# Patient Record
Sex: Female | Born: 2000 | Race: White | Hispanic: No | Marital: Single | Smoking: Never smoker
Health system: Southern US, Community
[De-identification: ages and names within clinical notes are randomized; demographics above are authoritative.]

## PROBLEM LIST (undated history)

## (undated) DIAGNOSIS — D582 Other hemoglobinopathies: Secondary | ICD-10-CM

## (undated) DIAGNOSIS — R000 Tachycardia, unspecified: Secondary | ICD-10-CM

## (undated) DIAGNOSIS — F419 Anxiety disorder, unspecified: Secondary | ICD-10-CM

## (undated) DIAGNOSIS — Z789 Other specified health status: Secondary | ICD-10-CM

## (undated) DIAGNOSIS — J45909 Unspecified asthma, uncomplicated: Secondary | ICD-10-CM

## (undated) DIAGNOSIS — I10 Essential (primary) hypertension: Secondary | ICD-10-CM

## (undated) DIAGNOSIS — K219 Gastro-esophageal reflux disease without esophagitis: Secondary | ICD-10-CM

## (undated) HISTORY — PX: OTHER SURGICAL HISTORY: SHX169

## (undated) HISTORY — DX: Tachycardia, unspecified: R00.0

## (undated) HISTORY — PX: TONSILLECTOMY: SUR1361

## (undated) HISTORY — DX: Essential (primary) hypertension: I10

## (undated) HISTORY — DX: Other specified health status: Z78.9

## (undated) HISTORY — DX: Unspecified asthma, uncomplicated: J45.909

---
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
## 2022-02-05 DIAGNOSIS — J449 Chronic obstructive pulmonary disease, unspecified: Secondary | ICD-10-CM | POA: Diagnosis not present

## 2022-02-05 DIAGNOSIS — J8281 Chronic eosinophilic pneumonia: Secondary | ICD-10-CM | POA: Diagnosis not present

## 2022-02-05 DIAGNOSIS — Z20822 Contact with and (suspected) exposure to covid-19: Secondary | ICD-10-CM | POA: Diagnosis not present

## 2022-02-05 DIAGNOSIS — J9601 Acute respiratory failure with hypoxia: Secondary | ICD-10-CM | POA: Diagnosis not present

## 2022-02-05 DIAGNOSIS — J40 Bronchitis, not specified as acute or chronic: Secondary | ICD-10-CM | POA: Diagnosis not present

## 2022-02-05 DIAGNOSIS — R000 Tachycardia, unspecified: Secondary | ICD-10-CM | POA: Diagnosis not present

## 2022-02-06 DIAGNOSIS — R000 Tachycardia, unspecified: Secondary | ICD-10-CM | POA: Diagnosis not present

## 2022-02-06 DIAGNOSIS — J9601 Acute respiratory failure with hypoxia: Secondary | ICD-10-CM | POA: Diagnosis not present

## 2022-02-06 DIAGNOSIS — J8281 Chronic eosinophilic pneumonia: Secondary | ICD-10-CM | POA: Diagnosis not present

## 2022-02-07 DIAGNOSIS — F17210 Nicotine dependence, cigarettes, uncomplicated: Secondary | ICD-10-CM | POA: Diagnosis not present

## 2022-02-07 DIAGNOSIS — J9601 Acute respiratory failure with hypoxia: Secondary | ICD-10-CM | POA: Diagnosis not present

## 2022-02-07 DIAGNOSIS — J8283 Eosinophilic asthma: Secondary | ICD-10-CM | POA: Diagnosis not present

## 2022-02-07 DIAGNOSIS — J4551 Severe persistent asthma with (acute) exacerbation: Secondary | ICD-10-CM | POA: Diagnosis not present

## 2022-02-07 DIAGNOSIS — R000 Tachycardia, unspecified: Secondary | ICD-10-CM | POA: Diagnosis not present

## 2022-02-07 DIAGNOSIS — F17290 Nicotine dependence, other tobacco product, uncomplicated: Secondary | ICD-10-CM | POA: Diagnosis not present

## 2022-02-08 DIAGNOSIS — J9601 Acute respiratory failure with hypoxia: Secondary | ICD-10-CM | POA: Diagnosis not present

## 2022-02-08 DIAGNOSIS — F17290 Nicotine dependence, other tobacco product, uncomplicated: Secondary | ICD-10-CM | POA: Diagnosis not present

## 2022-02-08 DIAGNOSIS — J4551 Severe persistent asthma with (acute) exacerbation: Secondary | ICD-10-CM | POA: Diagnosis not present

## 2022-02-08 DIAGNOSIS — J8283 Eosinophilic asthma: Secondary | ICD-10-CM | POA: Diagnosis not present

## 2022-02-08 DIAGNOSIS — R000 Tachycardia, unspecified: Secondary | ICD-10-CM | POA: Diagnosis not present

## 2022-04-26 ENCOUNTER — Emergency Department
Admission: EM | Admit: 2022-04-26 | Discharge: 2022-04-26 | Disposition: A | Discharge status: Home / Self Care | Payer: Medicaid Other | Attending: Student in an Organized Health Care Education/Training Program | Admitting: Student in an Organized Health Care Education/Training Program

## 2022-04-26 ENCOUNTER — Ambulatory Visit (LOCAL_COMMUNITY_HEALTH_CENTER): Payer: Medicaid Other | Admitting: Nurse Practitioner

## 2022-04-26 ENCOUNTER — Other Ambulatory Visit: Payer: Self-pay

## 2022-04-26 ENCOUNTER — Encounter: Payer: Self-pay | Admitting: *Deleted

## 2022-04-26 ENCOUNTER — Emergency Department: Payer: Medicaid Other

## 2022-04-26 VITALS — BP 136/91 | HR 136 | Ht 62.0 in | Wt 211.6 lb

## 2022-04-26 DIAGNOSIS — J101 Influenza due to other identified influenza virus with other respiratory manifestations: Secondary | ICD-10-CM | POA: Diagnosis not present

## 2022-04-26 DIAGNOSIS — Z0000 Encounter for general adult medical examination without abnormal findings: Secondary | ICD-10-CM

## 2022-04-26 DIAGNOSIS — R000 Tachycardia, unspecified: Secondary | ICD-10-CM | POA: Diagnosis not present

## 2022-04-26 DIAGNOSIS — Z3042 Encounter for surveillance of injectable contraceptive: Secondary | ICD-10-CM | POA: Diagnosis not present

## 2022-04-26 DIAGNOSIS — Z3009 Encounter for other general counseling and advice on contraception: Secondary | ICD-10-CM | POA: Diagnosis not present

## 2022-04-26 DIAGNOSIS — I10 Essential (primary) hypertension: Secondary | ICD-10-CM | POA: Diagnosis not present

## 2022-04-26 DIAGNOSIS — R0602 Shortness of breath: Secondary | ICD-10-CM | POA: Diagnosis not present

## 2022-04-26 DIAGNOSIS — Z20822 Contact with and (suspected) exposure to covid-19: Secondary | ICD-10-CM | POA: Diagnosis not present

## 2022-04-26 DIAGNOSIS — J45909 Unspecified asthma, uncomplicated: Secondary | ICD-10-CM | POA: Insufficient documentation

## 2022-04-26 DIAGNOSIS — R059 Cough, unspecified: Secondary | ICD-10-CM | POA: Diagnosis not present

## 2022-04-26 DIAGNOSIS — J029 Acute pharyngitis, unspecified: Secondary | ICD-10-CM | POA: Diagnosis not present

## 2022-04-26 LAB — BASIC METABOLIC PANEL
Anion gap: 9 (ref 5–15)
BUN: 9 mg/dL (ref 6–20)
CO2: 20 mmol/L — ABNORMAL LOW (ref 22–32)
Calcium: 9 mg/dL (ref 8.9–10.3)
Chloride: 107 mmol/L (ref 98–111)
Creatinine, Ser: 0.82 mg/dL (ref 0.44–1.00)
GFR, Estimated: >60 mL/min (ref >60)
Glucose, Bld: 109 mg/dL — ABNORMAL HIGH (ref 70–99)
Potassium: 3.6 mmol/L (ref 3.5–5.1)
Sodium: 136 mmol/L (ref 135–145)

## 2022-04-26 LAB — RESP PANEL BY RT-PCR (FLU A&B, COVID) ARPGX2
Influenza A by PCR: POSITIVE — AB
Influenza B by PCR: NEGATIVE
SARS Coronavirus 2 by RT PCR: NEGATIVE

## 2022-04-26 LAB — CBC WITH DIFFERENTIAL/PLATELET
Abs Immature Granulocytes: 0.02 10*3/uL (ref 0.00–0.07)
Basophils Absolute: 0.1 10*3/uL (ref 0.0–0.1)
Basophils Relative: 1 %
Eosinophils Absolute: 0 10*3/uL (ref 0.0–0.5)
Eosinophils Relative: 0 %
HCT: 40.1 % (ref 36.0–46.0)
Hemoglobin: 14 g/dL (ref 12.0–15.0)
Immature Granulocytes: 0 %
Lymphocytes Relative: 8 %
Lymphs Abs: 0.5 10*3/uL — ABNORMAL LOW (ref 0.7–4.0)
MCH: 25.9 pg — ABNORMAL LOW (ref 26.0–34.0)
MCHC: 34.9 g/dL (ref 30.0–36.0)
MCV: 74.3 fL — ABNORMAL LOW (ref 80.0–100.0)
Monocytes Absolute: 1 10*3/uL (ref 0.1–1.0)
Monocytes Relative: 13 %
Neutro Abs: 5.5 10*3/uL (ref 1.7–7.7)
Neutrophils Relative %: 78 %
Platelets: 383 10*3/uL (ref 150–400)
RBC: 5.4 MIL/uL — ABNORMAL HIGH (ref 3.87–5.11)
RDW: 13.5 % (ref 11.5–15.5)
WBC: 7.1 10*3/uL (ref 4.0–10.5)
nRBC: 0 % (ref 0.0–0.2)

## 2022-04-26 LAB — POC URINE PREG, ED: Preg Test, Ur: NEGATIVE

## 2022-04-26 LAB — GROUP A STREP BY PCR: Group A Strep by PCR: NOT DETECTED

## 2022-04-26 MED ORDER — IPRATROPIUM-ALBUTEROL 0.5-2.5 (3) MG/3ML IN SOLN
3.0000 mL | Freq: Once | RESPIRATORY_TRACT | Status: AC
  Administered 2022-04-26: 3 mL via RESPIRATORY_TRACT
  Filled 2022-04-26: qty 3

## 2022-04-26 MED ORDER — ACETAMINOPHEN 500 MG PO TABS
1000.0000 mg | ORAL_TABLET | Freq: Once | ORAL | Status: AC
  Administered 2022-04-26: 1000 mg via ORAL
  Filled 2022-04-26: qty 2

## 2022-04-26 MED ORDER — IBUPROFEN 600 MG PO TABS
600.0000 mg | ORAL_TABLET | Freq: Once | ORAL | Status: AC
  Administered 2022-04-26: 600 mg via ORAL
  Filled 2022-04-26: qty 1

## 2022-04-26 MED ORDER — OSELTAMIVIR PHOSPHATE 75 MG PO CAPS: 75.0000 mg | ORAL_CAPSULE | Freq: Two times a day (BID) | ORAL | 0 refills | Status: AC

## 2022-04-26 MED ORDER — ACETAMINOPHEN 325 MG PO TABS: 650.0000 mg | ORAL_TABLET | Freq: Once | ORAL | Status: DC

## 2022-04-26 MED ORDER — MEDROXYPROGESTERONE ACETATE 150 MG/ML IM SUSP
150.0000 mg | INTRAMUSCULAR | Status: AC
  Administered 2022-04-26 – 2022-12-16 (×4): 150 mg via INTRAMUSCULAR

## 2022-04-26 MED ORDER — SODIUM CHLORIDE 0.9 % IV BOLUS
1000.0000 mL | Freq: Once | INTRAVENOUS | Status: AC
  Administered 2022-04-26: 1000 mL via INTRAVENOUS

## 2022-04-26 NOTE — Progress Notes (Signed)
[facility] DEPARTMENT [facility] [address] Main Number: [phone]    Family Planning Visit- Initial Visit  Subjective:  April Rush is a 21 y.o.  G0P0000   being seen today for an initial annual visit and to discuss reproductive life planning.  The patient is currently using Hormonal Injection for pregnancy prevention. Patient reports   does want a pregnancy in the next year.     report they are looking for a method that provides High efficacy at preventing pregnancy  Patient has the following medical conditions has Obesity BMI=37.0 on their problem list.  Chief Complaint  Patient presents with   Contraception    Continue Depo with [place].     Patient reports to clinic today for a physical and Depo.     Body mass index is 38.7 kg/m. - Patient is eligible for diabetes screening based on BMI and age >81?  not applicable HA1C ordered? not applicable  Patient reports 1  partner/s in last year. Desires STI screening?  No - refused  Has patient been screened once for HCV in the past?  No    Does the patient have current drug use (including MJ), have a partner with drug use, and/or has been incarcerated since last result? No  If yes-- Screen for HCV through [facility]   Does the patient meet criteria for HBV testing? No  Criteria:  -Household, sexual or needle sharing contact with HBV -HIV positive -Those with known Hep C   Health Maintenance Due  Topic Date Due   COVID-19 Vaccine (1) Never done   CHLAMYDIA SCREENING  Never done   HIV Screening  Never done   Hepatitis C Screening  Never done   INFLUENZA VACCINE  Never done    Review of Systems  Constitutional:  Negative for chills, fever, malaise/fatigue and weight loss.  HENT:  Negative for congestion, hearing loss and sore throat.   Eyes:  Negative for blurred vision, double vision and photophobia.  Respiratory:  Negative for shortness of breath.   Cardiovascular:   Negative for chest pain.  Gastrointestinal:  Negative for abdominal pain, blood in stool, constipation, diarrhea, heartburn, nausea and vomiting.  Genitourinary:  Negative for dysuria and frequency.  Musculoskeletal:  Negative for back pain, joint pain and neck pain.  Skin:  Negative for itching and rash.  Neurological:  Negative for dizziness, weakness and headaches.  Endo/Heme/Allergies:  Does not bruise/bleed easily.  Psychiatric/Behavioral:  Negative for depression, substance abuse and suicidal ideas.     The following portions of the patient's history were reviewed and updated as appropriate: allergies, current medications, past family history, past medical history, past social history, past surgical history and problem list. Problem list updated.   See flowsheet for other program required questions.  Objective:   Vitals:   04/26/22 1558  BP: (!) 136/91  Pulse: (!) 136  Weight: 211 lb 9.6 oz (96 kg)  Height: 5\' 2"  (1.575 m)    Physical Exam Constitutional:      Appearance: Normal appearance.  HENT:     Head: Normocephalic. No abrasion, masses or laceration. Hair is normal.     Jaw: No tenderness or swelling.     Right Ear: External ear normal.     Left Ear: External ear normal.     Nose: Nose normal.     Mouth/Throat:     Lips: Pink. No lesions.     Mouth: Mucous membranes are moist. No lacerations  or oral lesions.     Dentition: No dental caries.     Tongue: No lesions.     Palate: No mass and lesions.     Pharynx: No pharyngeal swelling, oropharyngeal exudate, posterior oropharyngeal erythema or uvula swelling.     Tonsils: No tonsillar exudate or tonsillar abscesses.  Eyes:     Pupils: Pupils are equal, round, and reactive to light.  Neck:     Thyroid: No thyroid mass, thyromegaly or thyroid tenderness.  Cardiovascular:     Rate and Rhythm: Normal rate and regular rhythm.  Pulmonary:     Effort: Pulmonary effort is normal.     Breath sounds: Normal breath  sounds.  Abdominal:     General: Abdomen is flat. Bowel sounds are normal.     Palpations: Abdomen is soft.     Tenderness: There is no abdominal tenderness. There is no rebound.  Genitourinary:    Comments: Deferred, declined genital exam  Musculoskeletal:     Cervical back: Full passive range of motion without pain and normal range of motion.  Lymphadenopathy:     Cervical: No cervical adenopathy.     Right cervical: No superficial, deep or posterior cervical adenopathy.    Left cervical: No superficial, deep or posterior cervical adenopathy.     Upper Body:     Right upper body: No epitrochlear adenopathy.     Left upper body: No epitrochlear adenopathy.  Skin:    General: Skin is warm and dry.     Findings: No erythema, laceration, lesion or rash.  Neurological:     Mental Status: She is alert and oriented to person, place, and time.  Psychiatric:        Attention and Perception: Attention normal.        Mood and Affect: Mood normal.        Speech: Speech normal.        Behavior: Behavior normal. Behavior is cooperative.       Assessment and Plan:  April Rush is a 21 y.o. female presenting to the [facility] Department for an initial annual wellness/contraceptive visit  Contraception counseling: Reviewed options based on patient desire and reproductive life plan. Patient is interested in Hormonal Injection. This was not provided to the patient today.   Risks, benefits, and typical effectiveness rates were reviewed.  Questions were answered.  Written information was also given to the patient to review.    The patient will follow up in  11 weeks for surveillance and Depo.  The patient was told to call with any further questions, or with any concerns about this method of contraception.  Emphasized use of condoms 100% of the time for STI prevention.  Need for ECP was assessed. ECP not offered due to continuous use of birth control.    1. Family planning  counseling -21 year old female in clinic today for a physical and birth control. -Rod reviewed, no complaints. -STD screening offered, patient declined. -Patient desires continue with Depo as her current birth control method.   2. Well woman exam (no gynecological exam) -Normal well woman exam. -CBE due at age 72. -PAP due at age 18.  3. Surveillance for Depo-Provera contraception -May have Depo 150 MG IM q 11-13 weeks x 1 year.  - medroxyPROGESTERone (DEPO-PROVERA) injection 150 mg   Total time spent: 20 minutes   Return in about 11 weeks (around 07/12/2022) for Routine DMPA injection.    Glenna Fellows, FNP

## 2022-04-26 NOTE — ED Triage Notes (Signed)
Pt has a sore throat, cough, nasal congestion, fever.  No chest pain or sob.  Pt alert.

## 2022-04-26 NOTE — ED Provider Notes (Addendum)
[facility] Provider Note    Event Date/Time   First MD Initiated Contact with Patient 04/26/22 2101     (approximate)   History   Sore Throat   HPI  April Rush is a 21 y.o. female   with a history of asthma, hypertension and tachycardia presents to the ER for evaluation of several days of nasal congestion cough sore throat.  Has had some nausea but no vomiting.  Denies any abdominal pain.  Denies any chance of being pregnant.      Physical Exam   Triage Vital Signs: ED Triage Vitals  Enc Vitals Group     BP 04/26/22 1927 (!) 135/95     Pulse Rate 04/26/22 1927 (!) 141     Resp 04/26/22 1927 20     Temp 04/26/22 1927 98.3 F (36.8 C)     Temp Source 04/26/22 1927 Oral     SpO2 04/26/22 1927 95 %     Weight 04/26/22 1928 211 lb (95.7 kg)     Height 04/26/22 1928 5\' 2"  (1.575 m)     Head Circumference --      Peak Flow --      Pain Score 04/26/22 1927 0     Pain Loc --      Pain Edu? --      Excl. in [place]? --     Most recent vital signs: Vitals:   04/26/22 2231 04/26/22 2310  BP: 127/61   Pulse: (!) 122 (!) 107  Resp: (!) 22   Temp: 99.3 F (37.4 C)   SpO2: 92% 96%     Constitutional: Alert  Eyes: Conjunctivae are normal.  Head: Atraumatic. Nose: +++ congestion/rhinnorhea. Mouth/Throat: Mucous membranes are moist.  Uvula midline, no exudates Neck: Painless ROM.  Cardiovascular:   Good peripheral circulation. tachycardic Respiratory: Normal respiratory effort.  No retractions.  Gastrointestinal: Soft and nontender.  Musculoskeletal:  no deformity Neurologic:  MAE spontaneously. No gross focal neurologic deficits are appreciated.  Skin:  Skin is warm, dry and intact. No rash noted. Psychiatric: Mood and affect are normal. Speech and behavior are normal.    ED Results / Procedures / Treatments   Labs (all labs ordered are listed, but only abnormal results are displayed) Labs Reviewed  RESP PANEL BY RT-PCR (FLU A&B,  COVID) ARPGX2 - Abnormal; Notable for the following components:      Result Value   Influenza A by PCR POSITIVE (*)    All other components within normal limits  CBC WITH DIFFERENTIAL/PLATELET - Abnormal; Notable for the following components:   RBC 5.40 (*)    MCV 74.3 (*)    MCH 25.9 (*)    Lymphs Abs 0.5 (*)    All other components within normal limits  BASIC METABOLIC PANEL - Abnormal; Notable for the following components:   CO2 20 (*)    Glucose, Bld 109 (*)    All other components within normal limits  GROUP A STREP BY PCR  POC URINE PREG, ED     EKG  ED ECG REPORT I, Merlyn Lot, the attending physician, personally viewed and interpreted this ECG.   Date: 04/26/2022  EKG Time: 19:30  Rate: 136  Rhythm: sinus  Axis: normal  Intervals:normal  ST&T Change: no depressions. no stemi    RADIOLOGY Please see ED Course for my review and interpretation.  I personally reviewed all radiographic images ordered to evaluate for the above acute complaints and reviewed radiology reports and  findings.  These findings were personally discussed with the patient.  Please see medical record for radiology report.    PROCEDURES:  Critical Care performed:   Procedures   MEDICATIONS ORDERED IN ED: Medications  sodium chloride 0.9 % bolus 1,000 mL (0 mLs Intravenous Stopped 04/26/22 2226)  acetaminophen (TYLENOL) tablet 1,000 mg (1,000 mg Oral Given 04/26/22 2223)  ibuprofen (ADVIL) tablet 600 mg (600 mg Oral Given 04/26/22 2310)  ipratropium-albuterol (DUONEB) 0.5-2.5 (3) MG/3ML nebulizer solution 3 mL (3 mLs Nebulization Given 04/26/22 2310)     IMPRESSION / MDM / ASSESSMENT AND PLAN / ED COURSE  I reviewed the triage vital signs and the nursing notes.                              Differential diagnosis includes, but is not limited to, flu, pneumonia, COVID, asthma, bronchitis, sinusitis,  Patient presented to the ER for evaluation symptoms as described above.  Does  appear to have URI clinically but fairly tachycardic.  Will give IV fluids as well as Tylenol will check for markers of sepsis.  Have a low suspicion for PE.   Clinical Course as of 04/26/22 2315  Mon Apr 26, 2022  2156 Patient is flu a positive.  Chest x-ray on my review and interpretation does not show any evidence of consolidation or pneumothorax. [PR]  2314 Heart rate improved now 105 after fluids.  Her presentation is consistent with flu.  She is not hypoxic she is in no acute distress.  Given nebulizer due to her history of asthma so improvement do not feel that she requires steroids at this time.  Does appear stable appropriate for outpatient follow-up. [PR]    Clinical Course User Index [PR] Willy Eddy, MD     FINAL CLINICAL IMPRESSION(S) / ED DIAGNOSES   Final diagnoses:  Influenza A     Rx / DC Orders   ED Discharge Orders          Ordered    oseltamivir (TAMIFLU) 75 MG capsule  2 times daily        04/26/22 2313             Note:  This document was prepared using Dragon voice recognition software and may include unintentional dictation errors.    Willy Eddy, MD 04/26/22 Juliann Pares    Willy Eddy, MD 04/26/22 [phone]

## 2022-04-26 NOTE — Progress Notes (Signed)
Pt appointment for PE and Depo. Seen by FNP White. Pt reported being on time with her Depo, but she was living in [place] until recently and got her last doses there. Family planning packet provided and contents reviewed. Depo administered.

## 2022-04-27 ENCOUNTER — Encounter: Payer: Self-pay | Admitting: Nurse Practitioner

## 2022-07-15 ENCOUNTER — Ambulatory Visit (LOCAL_COMMUNITY_HEALTH_CENTER): Payer: Medicaid Other

## 2022-07-15 ENCOUNTER — Ambulatory Visit: Payer: Medicaid Other

## 2022-07-15 VITALS — BP 126/63 | Ht 62.0 in | Wt 212.0 lb

## 2022-07-15 DIAGNOSIS — Z30013 Encounter for initial prescription of injectable contraceptive: Secondary | ICD-10-CM

## 2022-07-15 DIAGNOSIS — Z308 Encounter for other contraceptive management: Secondary | ICD-10-CM

## 2022-07-15 DIAGNOSIS — Z3042 Encounter for surveillance of injectable contraceptive: Secondary | ICD-10-CM

## 2022-07-15 DIAGNOSIS — Z3009 Encounter for other general counseling and advice on contraception: Secondary | ICD-10-CM

## 2022-07-15 NOTE — Progress Notes (Signed)
11 weeks 3 days post depo.  Denies any problems and desires to continue depo.  Per order by A. White FNP, depo administered IM RUOQ; tolerated well.  Next depo due 09/30/22 and appt reminder given.   Tonny Branch, RN

## 2022-08-04 ENCOUNTER — Ambulatory Visit
Admission: EM | Admit: 2022-08-04 | Discharge: 2022-08-04 | Disposition: A | Discharge status: Home / Self Care | Payer: Medicaid Other | Attending: Urgent Care | Admitting: Urgent Care

## 2022-08-04 DIAGNOSIS — J069 Acute upper respiratory infection, unspecified: Secondary | ICD-10-CM

## 2022-08-04 MED ORDER — AZELASTINE HCL 0.1 % NA SOLN: 1.0000 | Freq: Two times a day (BID) | NASAL | 12 refills | Status: AC

## 2022-08-04 NOTE — ED Provider Notes (Signed)
Roderic Palau    CSN: [id number] Arrival date & time: 08/04/22  1427      History   Chief Complaint Chief Complaint  Patient presents with   Cough    HPI April Rush is a 22 y.o. female.    Cough   Presents with symptoms x 2 days including cough, headache, nausea, runny nose, sinus pressure and pain, sore throat.  She endorses chronic rhinitis x 3 to 4 months.   Past Medical History:  Diagnosis Date   Asthma    Hypertension    Patient denies medical problems    Tachycardia     Patient Active Problem List   Diagnosis Date Noted   Obesity BMI=37.0 10/16/2019    Past Surgical History:  Procedure Laterality Date   Denies surgical history      OB History     Gravida  0   Para  0   Term  0   Preterm  0   AB  0   Living  0      SAB  0   IAB  0   Ectopic  0   Multiple  0   Live Births  0            Home Medications    Prior to Admission medications   Medication Sig Start Date End Date Taking? Authorizing Provider  budesonide-formoterol (SYMBICORT) 160-4.5 MCG/ACT inhaler Inhale 2 puffs into the lungs 2 (two) times daily.    [provider]  cetirizine (ZYRTEC) 10 MG chewable tablet Chew 10 mg by mouth daily.    [provider]  montelukast (SINGULAIR) 10 MG tablet Take 10 mg by mouth at bedtime.    [provider]    Family History Family History  Problem Relation Age of Onset   Cirrhosis Maternal Grandfather        not related to alcohol   Hypertension Father     Social History Social History   Tobacco Use   Smoking status: Never   Smokeless tobacco: Never  Vaping Use   Vaping Use: Former   Substances: Nicotine, Flavoring  Substance Use Topics   Alcohol use: Not Currently    Comment: "tried a sip" before   Drug use: Never     Allergies   Pollen extract-tree extract [pollen extract], Dog epithelium, and Melatonin   Review of Systems Review of Systems  Respiratory:   Positive for cough.      Physical Exam Triage Vital Signs ED Triage Vitals  Enc Vitals Group     BP 08/04/22 1441 131/89     Pulse Rate 08/04/22 1441 86     Resp 08/04/22 1441 18     Temp 08/04/22 1441 98.2 F (36.8 C)     Temp Source 08/04/22 1441 Oral     SpO2 08/04/22 1441 95 %     Weight --      Height --      Head Circumference --      Peak Flow --      Pain Score 08/04/22 1442 5     Pain Loc --      Pain Edu? --      Excl. in [place]? --    No data found.  Updated Vital Signs BP 131/89 (BP Location: Right Arm)   Pulse 86   Temp 98.2 F (36.8 C) (Oral)   Resp 18   SpO2 95%   Visual Acuity Right Eye Distance:   Left  Eye Distance:   Bilateral Distance:    Right Eye Near:   Left Eye Near:    Bilateral Near:     Physical Exam Vitals reviewed.  Constitutional:      Appearance: Normal appearance.  HENT:     Nose:     Right Turbinates: Swollen.     Left Turbinates: Swollen.  Cardiovascular:     Rate and Rhythm: Normal rate and regular rhythm.     Pulses: Normal pulses.     Heart sounds: Normal heart sounds.  Pulmonary:     Effort: Pulmonary effort is normal.     Breath sounds: Normal breath sounds.  Skin:    General: Skin is warm and dry.  Neurological:     General: No focal deficit present.     Mental Status: She is alert and oriented to person, place, and time.  Psychiatric:        Mood and Affect: Mood normal.        Behavior: Behavior normal.      UC Treatments / Results  Labs (all labs ordered are listed, but only abnormal results are displayed) Labs Reviewed - No data to display  EKG   Radiology No results found.  Procedures Procedures (including critical care time)  Medications Ordered in UC Medications - No data to display  Initial Impression / Assessment and Plan / UC Course  I have reviewed the triage vital signs and the nursing notes.  Pertinent labs & imaging results that were available during my care of the patient were  reviewed by me and considered in my medical decision making (see chart for details).   Patient is afebrile here without recent antipyretics. Satting well on room air. Overall is well appearing, well hydrated, without respiratory distress. Pulmonary exam is unremarkable.  Lungs CTAB without wheezing, rhonchi, rales.  Patient's symptoms are consistent with an acute viral process.  Recommending use of OTC medication for symptom control.  Will prescribe Astelin nasal spray for rhinitis.  Final Clinical Impressions(s) / UC Diagnoses   Final diagnoses:  None   Discharge Instructions   None    ED Prescriptions   None    PDMP not reviewed this encounter.   Rose Phi, [place] 08/04/22 1456

## 2022-08-04 NOTE — Discharge Instructions (Addendum)
Recommend use of over-the-counter cold/cough medication such as DayQuil and NyQuil.  Use the prescribed nasal spray as indicated to reduce nasal discharge and postnasal drip.

## 2022-08-04 NOTE — ED Triage Notes (Signed)
Pt states she is having cough, headache, nausea, runny nose, with sinus pressure and pain, sore throat that started Monday. States she had a runny nose for the past 3-4 months but now having other symptoms along with it. Not taking any OTC medication to help symptoms right now.

## 2022-08-25 ENCOUNTER — Other Ambulatory Visit: Payer: Self-pay

## 2022-08-25 ENCOUNTER — Emergency Department
Admission: EM | Admit: 2022-08-25 | Discharge: 2022-08-25 | Disposition: A | Discharge status: Home / Self Care | Payer: Medicaid Other | Attending: Emergency Medicine | Admitting: Emergency Medicine

## 2022-08-25 DIAGNOSIS — R569 Unspecified convulsions: Secondary | ICD-10-CM | POA: Diagnosis not present

## 2022-08-25 DIAGNOSIS — R410 Disorientation, unspecified: Secondary | ICD-10-CM | POA: Diagnosis not present

## 2022-08-25 DIAGNOSIS — R404 Transient alteration of awareness: Secondary | ICD-10-CM | POA: Diagnosis not present

## 2022-08-25 DIAGNOSIS — R55 Syncope and collapse: Secondary | ICD-10-CM | POA: Diagnosis not present

## 2022-08-25 DIAGNOSIS — R0902 Hypoxemia: Secondary | ICD-10-CM | POA: Diagnosis not present

## 2022-08-25 LAB — URINALYSIS, ROUTINE W REFLEX MICROSCOPIC
Bilirubin Urine: NEGATIVE
Glucose, UA: NEGATIVE mg/dL
Hgb urine dipstick: NEGATIVE
Ketones, ur: 5 mg/dL — AB
Nitrite: NEGATIVE
Protein, ur: 30 mg/dL — AB
Specific Gravity, Urine: 1.018 (ref 1.005–1.030)
pH: 5 (ref 5.0–8.0)

## 2022-08-25 LAB — CBC WITH DIFFERENTIAL/PLATELET
Abs Immature Granulocytes: 0.06 10*3/uL (ref 0.00–0.07)
Basophils Absolute: 0.1 10*3/uL (ref 0.0–0.1)
Basophils Relative: 1 %
Eosinophils Absolute: 1.3 10*3/uL — ABNORMAL HIGH (ref 0.0–0.5)
Eosinophils Relative: 9 %
HCT: 42.3 % (ref 36.0–46.0)
Hemoglobin: 14.5 g/dL (ref 12.0–15.0)
Immature Granulocytes: 0 %
Lymphocytes Relative: 35 %
Lymphs Abs: 5.1 10*3/uL — ABNORMAL HIGH (ref 0.7–4.0)
MCH: 26.6 pg (ref 26.0–34.0)
MCHC: 34.3 g/dL (ref 30.0–36.0)
MCV: 77.6 fL — ABNORMAL LOW (ref 80.0–100.0)
Monocytes Absolute: 0.6 10*3/uL (ref 0.1–1.0)
Monocytes Relative: 4 %
Neutro Abs: 7.3 10*3/uL (ref 1.7–7.7)
Neutrophils Relative %: 51 %
Platelets: 445 10*3/uL — ABNORMAL HIGH (ref 150–400)
RBC: 5.45 MIL/uL — ABNORMAL HIGH (ref 3.87–5.11)
RDW: 13.8 % (ref 11.5–15.5)
WBC: 14.4 10*3/uL — ABNORMAL HIGH (ref 4.0–10.5)
nRBC: 0 % (ref 0.0–0.2)

## 2022-08-25 LAB — BASIC METABOLIC PANEL
Anion gap: 9 (ref 5–15)
BUN: 11 mg/dL (ref 6–20)
CO2: 18 mmol/L — ABNORMAL LOW (ref 22–32)
Calcium: 7.8 mg/dL — ABNORMAL LOW (ref 8.9–10.3)
Chloride: 112 mmol/L — ABNORMAL HIGH (ref 98–111)
Creatinine, Ser: 0.82 mg/dL (ref 0.44–1.00)
GFR, Estimated: >60 mL/min (ref >60)
Glucose, Bld: 110 mg/dL — ABNORMAL HIGH (ref 70–99)
Potassium: 3.9 mmol/L (ref 3.5–5.1)
Sodium: 139 mmol/L (ref 135–145)

## 2022-08-25 LAB — POCT PREGNANCY, URINE

## 2022-08-25 MED ORDER — SODIUM CHLORIDE 0.9 % IV BOLUS
1000.0000 mL | Freq: Once | INTRAVENOUS | Status: AC
  Administered 2022-08-25: 1000 mL via INTRAVENOUS

## 2022-08-25 MED ORDER — ONDANSETRON HCL 4 MG/2ML IJ SOLN
4.0000 mg | Freq: Once | INTRAMUSCULAR | Status: AC
  Administered 2022-08-25: 4 mg via INTRAVENOUS
  Filled 2022-08-25: qty 2

## 2022-08-25 NOTE — Discharge Instructions (Addendum)
Please seek medical attention for any high fevers, chest pain, shortness of breath, change in behavior, persistent vomiting, bloody stool or any other new or concerning symptoms.  

## 2022-08-25 NOTE — ED Triage Notes (Signed)
Pt arrived via EMS for multiple syncopal episodes after being in a tanning bed. Pt states she was in the bed, started to feel really anxious, got out of the bed and her vision got blurry then she blacked out. Pt states she just slid down the wall but could not see anything for a few minutes. Pt states she remembers the whole event. Pt states she has had syncopal episodes before and it was due to a kidney issue. Pt reports still having some blurred vision. Per EMS on arrival pt BP was 50/30's, then after a 539ml bolus of LR pt BP came up to 100/40's. Pt A&Ox4 at this time.

## 2022-08-25 NOTE — ED Provider Notes (Signed)
[facility] Provider Note    Event Date/Time   First MD Initiated Contact with Patient 08/25/22 1715     (approximate)   History   Loss of Consciousness   HPI  April Rush is a 22 y.o. female who presents to the emergency department today after a syncopal episode.  The patient had been in a tanning bed.  She then started feeling claustrophobic and had a slight panic attack.  When she tried to sit up and get out she felt lightheaded.  She then passed out.  She denies any chest pain.  When EMS first arrived they reported very low blood pressure.  They did give 500 cc bolus.  Patient states she is feeling better at the time my exam.  She has passed out once before in her life a number of years ago.     Physical Exam   Triage Vital Signs: ED Triage Vitals  Enc Vitals Group     BP 08/25/22 1715 119/71     Pulse Rate 08/25/22 1715 83     Resp 08/25/22 1715 (!) 21     Temp 08/25/22 1715 98 F (36.7 C)     Temp src --      SpO2 08/25/22 1715 96 %     Weight 08/25/22 1736 212 lb 1.3 oz (96.2 kg)     Height 08/25/22 1736 5\' 2"  (1.575 m)     Head Circumference --      Peak Flow --      Pain Score 08/25/22 1736 0     Pain Loc --      Pain Edu? --      Excl. in [place]? --     Most recent vital signs: Vitals:   08/25/22 1715 08/25/22 1730  BP: 119/71 103/78  Pulse: 83 93  Resp: (!) 21 18  Temp: 98 F (36.7 C)   SpO2: 96% 94%   General: Awake, alert, oriented. CV:  Good peripheral perfusion. Regular rate and rhythm. Resp:  Normal effort. Lungs clear. Abd:  No distention.    ED Results / Procedures / Treatments   Labs (all labs ordered are listed, but only abnormal results are displayed) Labs Reviewed  URINALYSIS, ROUTINE W REFLEX MICROSCOPIC - Abnormal; Notable for the following components:      Result Value   Color, Urine YELLOW (*)    APPearance HAZY (*)    Ketones, ur 5 (*)    Protein, ur 30 (*)    Leukocytes,Ua TRACE (*)    Bacteria,  UA MANY (*)    All other components within normal limits  CBC WITH DIFFERENTIAL/PLATELET - Abnormal; Notable for the following components:   WBC 14.4 (*)    RBC 5.45 (*)    MCV 77.6 (*)    Platelets 445 (*)    Lymphs Abs 5.1 (*)    Eosinophils Absolute 1.3 (*)    All other components within normal limits  BASIC METABOLIC PANEL - Abnormal; Notable for the following components:   Chloride 112 (*)    CO2 18 (*)    Glucose, Bld 110 (*)    Calcium 7.8 (*)    All other components within normal limits     EKG  I, Nance Pear, attending physician, personally viewed and interpreted this EKG  EKG Time: 1722 Rate: 87 Rhythm: sinus rhythm Axis: normal Intervals: qtc 421 QRS: narrow ST changes: no st elevation Impression: normal ekg   RADIOLOGY None  PROCEDURES:  Critical Care performed: N    MEDICATIONS ORDERED IN ED: Medications  ondansetron (ZOFRAN) injection 4 mg (has no administration in time range)  sodium chloride 0.9 % bolus 1,000 mL (1,000 mLs Intravenous New Bag/Given 08/25/22 1756)     IMPRESSION / MDM / ASSESSMENT AND PLAN / ED COURSE  I reviewed the triage vital signs and the nursing notes.                              Differential diagnosis includes, but is not limited to, anemia, electrolyte abnormality, arrhythmia, vaso vagal, dehydration.  Patient's presentation is most consistent with acute presentation with potential threat to life or bodily function.   The patient is on the cardiac monitor to evaluate for evidence of arrhythmia and/or significant heart rate changes.  Patient presented to the emergency department today after syncopal episode. On arrival patient's blood pressure had improved. Blood work was checked which did not show any concerning anemia or electrolyte abnormality. EKG without arrhythmia. Patient was on the cardiac monitor for a number of hours without any concerning arrhythmia. At this time do think it is likely a vaso vagal  episode. Think it is reasonable for patient to be discharged home. Discussed return precautions.       FINAL CLINICAL IMPRESSION(S) / ED DIAGNOSES   Final diagnoses:  Syncope, unspecified syncope type    Note:  This document was prepared using Dragon voice recognition software and may include unintentional dictation errors.    Nance Pear, MD 08/26/22 [phone]

## 2022-08-25 NOTE — ED Notes (Signed)
Pt given meal tray and water 

## 2022-09-22 DIAGNOSIS — J019 Acute sinusitis, unspecified: Secondary | ICD-10-CM | POA: Diagnosis not present

## 2022-09-22 DIAGNOSIS — J209 Acute bronchitis, unspecified: Secondary | ICD-10-CM | POA: Diagnosis not present

## 2022-09-22 DIAGNOSIS — B9689 Other specified bacterial agents as the cause of diseases classified elsewhere: Secondary | ICD-10-CM | POA: Diagnosis not present

## 2022-09-22 DIAGNOSIS — Z03818 Encounter for observation for suspected exposure to other biological agents ruled out: Secondary | ICD-10-CM | POA: Diagnosis not present

## 2022-09-30 ENCOUNTER — Ambulatory Visit: Payer: Medicaid Other

## 2022-09-30 VITALS — BP 118/86 | Ht 62.0 in | Wt 209.5 lb

## 2022-09-30 DIAGNOSIS — G901 Familial dysautonomia [Riley-Day]: Secondary | ICD-10-CM | POA: Diagnosis not present

## 2022-09-30 DIAGNOSIS — J4540 Moderate persistent asthma, uncomplicated: Secondary | ICD-10-CM | POA: Diagnosis not present

## 2022-09-30 DIAGNOSIS — Z0000 Encounter for general adult medical examination without abnormal findings: Secondary | ICD-10-CM | POA: Diagnosis not present

## 2022-09-30 DIAGNOSIS — E7800 Pure hypercholesterolemia, unspecified: Secondary | ICD-10-CM | POA: Diagnosis not present

## 2022-09-30 DIAGNOSIS — Z309 Encounter for contraceptive management, unspecified: Secondary | ICD-10-CM | POA: Diagnosis not present

## 2022-09-30 DIAGNOSIS — Z3042 Encounter for surveillance of injectable contraceptive: Secondary | ICD-10-CM

## 2022-09-30 DIAGNOSIS — Z131 Encounter for screening for diabetes mellitus: Secondary | ICD-10-CM | POA: Diagnosis not present

## 2022-09-30 DIAGNOSIS — J309 Allergic rhinitis, unspecified: Secondary | ICD-10-CM | POA: Diagnosis not present

## 2022-09-30 DIAGNOSIS — Z130 Encounter for screening for diseases of the blood and blood-forming organs and certain disorders involving the immune mechanism: Secondary | ICD-10-CM | POA: Diagnosis not present

## 2022-09-30 DIAGNOSIS — Z30013 Encounter for initial prescription of injectable contraceptive: Secondary | ICD-10-CM | POA: Diagnosis not present

## 2022-09-30 DIAGNOSIS — Z1329 Encounter for screening for other suspected endocrine disorder: Secondary | ICD-10-CM | POA: Diagnosis not present

## 2022-09-30 DIAGNOSIS — Z3009 Encounter for other general counseling and advice on contraception: Secondary | ICD-10-CM

## 2022-09-30 DIAGNOSIS — Z1331 Encounter for screening for depression: Secondary | ICD-10-CM | POA: Diagnosis not present

## 2022-09-30 DIAGNOSIS — Z23 Encounter for immunization: Secondary | ICD-10-CM | POA: Diagnosis not present

## 2022-09-30 DIAGNOSIS — K219 Gastro-esophageal reflux disease without esophagitis: Secondary | ICD-10-CM | POA: Diagnosis not present

## 2022-09-30 NOTE — Progress Notes (Signed)
11 weeks and 0 day post depo.  Voice no concerns.   Depo given today by Berline Lopes in [place] tolerated well per ordered by Onalee Hua, FNP dated 04/26/2022.  Next Depo due 12/16/2022.    Berenise Hunton Sherrilyn Rist, RN

## 2022-10-01 DIAGNOSIS — E7800 Pure hypercholesterolemia, unspecified: Secondary | ICD-10-CM | POA: Diagnosis not present

## 2022-10-01 DIAGNOSIS — Z131 Encounter for screening for diabetes mellitus: Secondary | ICD-10-CM | POA: Diagnosis not present

## 2022-10-07 DIAGNOSIS — D72829 Elevated white blood cell count, unspecified: Secondary | ICD-10-CM | POA: Diagnosis not present

## 2022-10-07 DIAGNOSIS — R5383 Other fatigue: Secondary | ICD-10-CM | POA: Diagnosis not present

## 2022-10-07 DIAGNOSIS — R55 Syncope and collapse: Secondary | ICD-10-CM | POA: Diagnosis not present

## 2022-10-07 DIAGNOSIS — R42 Dizziness and giddiness: Secondary | ICD-10-CM | POA: Diagnosis not present

## 2022-10-07 DIAGNOSIS — G901 Familial dysautonomia [Riley-Day]: Secondary | ICD-10-CM | POA: Diagnosis not present

## 2022-10-15 DIAGNOSIS — J3081 Allergic rhinitis due to animal (cat) (dog) hair and dander: Secondary | ICD-10-CM | POA: Diagnosis not present

## 2022-10-15 DIAGNOSIS — J4520 Mild intermittent asthma, uncomplicated: Secondary | ICD-10-CM | POA: Diagnosis not present

## 2022-10-15 DIAGNOSIS — J3089 Other allergic rhinitis: Secondary | ICD-10-CM | POA: Diagnosis not present

## 2022-10-15 DIAGNOSIS — J301 Allergic rhinitis due to pollen: Secondary | ICD-10-CM | POA: Diagnosis not present

## 2022-10-18 DIAGNOSIS — Z7689 Persons encountering health services in other specified circumstances: Secondary | ICD-10-CM | POA: Diagnosis not present

## 2022-10-18 DIAGNOSIS — R0989 Other specified symptoms and signs involving the circulatory and respiratory systems: Secondary | ICD-10-CM | POA: Diagnosis not present

## 2022-10-18 DIAGNOSIS — E669 Obesity, unspecified: Secondary | ICD-10-CM | POA: Diagnosis not present

## 2022-10-18 DIAGNOSIS — E7800 Pure hypercholesterolemia, unspecified: Secondary | ICD-10-CM | POA: Diagnosis not present

## 2022-10-18 DIAGNOSIS — R55 Syncope and collapse: Secondary | ICD-10-CM | POA: Diagnosis not present

## 2022-10-18 DIAGNOSIS — R42 Dizziness and giddiness: Secondary | ICD-10-CM | POA: Diagnosis not present

## 2022-10-18 DIAGNOSIS — J4540 Moderate persistent asthma, uncomplicated: Secondary | ICD-10-CM | POA: Diagnosis not present

## 2022-10-19 DIAGNOSIS — J301 Allergic rhinitis due to pollen: Secondary | ICD-10-CM | POA: Diagnosis not present

## 2022-10-20 DIAGNOSIS — J3089 Other allergic rhinitis: Secondary | ICD-10-CM | POA: Diagnosis not present

## 2022-10-20 DIAGNOSIS — J3081 Allergic rhinitis due to animal (cat) (dog) hair and dander: Secondary | ICD-10-CM | POA: Diagnosis not present

## 2022-10-28 DIAGNOSIS — J3081 Allergic rhinitis due to animal (cat) (dog) hair and dander: Secondary | ICD-10-CM | POA: Diagnosis not present

## 2022-10-28 DIAGNOSIS — J301 Allergic rhinitis due to pollen: Secondary | ICD-10-CM | POA: Diagnosis not present

## 2022-10-28 DIAGNOSIS — J3089 Other allergic rhinitis: Secondary | ICD-10-CM | POA: Diagnosis not present

## 2022-11-02 DIAGNOSIS — R42 Dizziness and giddiness: Secondary | ICD-10-CM | POA: Diagnosis not present

## 2022-11-03 DIAGNOSIS — R42 Dizziness and giddiness: Secondary | ICD-10-CM | POA: Diagnosis not present

## 2022-11-03 DIAGNOSIS — R110 Nausea: Secondary | ICD-10-CM | POA: Diagnosis not present

## 2022-11-03 DIAGNOSIS — G44009 Cluster headache syndrome, unspecified, not intractable: Secondary | ICD-10-CM | POA: Diagnosis not present

## 2022-11-03 DIAGNOSIS — R55 Syncope and collapse: Secondary | ICD-10-CM | POA: Diagnosis not present

## 2022-11-04 ENCOUNTER — Other Ambulatory Visit: Payer: Self-pay | Admitting: Neurology

## 2022-11-04 ENCOUNTER — Encounter: Payer: Self-pay | Admitting: Neurology

## 2022-11-04 DIAGNOSIS — J3081 Allergic rhinitis due to animal (cat) (dog) hair and dander: Secondary | ICD-10-CM | POA: Diagnosis not present

## 2022-11-04 DIAGNOSIS — R42 Dizziness and giddiness: Secondary | ICD-10-CM

## 2022-11-04 DIAGNOSIS — J3089 Other allergic rhinitis: Secondary | ICD-10-CM | POA: Diagnosis not present

## 2022-11-04 DIAGNOSIS — J301 Allergic rhinitis due to pollen: Secondary | ICD-10-CM | POA: Diagnosis not present

## 2022-11-04 DIAGNOSIS — R55 Syncope and collapse: Secondary | ICD-10-CM

## 2022-11-05 ENCOUNTER — Ambulatory Visit
Admission: RE | Admit: 2022-11-05 | Discharge: 2022-11-05 | Disposition: A | Discharge status: Home / Self Care | Payer: Medicaid Other | Source: Ambulatory Visit | Attending: Neurology | Admitting: Neurology

## 2022-11-05 DIAGNOSIS — R42 Dizziness and giddiness: Secondary | ICD-10-CM

## 2022-11-05 DIAGNOSIS — R55 Syncope and collapse: Secondary | ICD-10-CM

## 2022-11-05 DIAGNOSIS — J3489 Other specified disorders of nose and nasal sinuses: Secondary | ICD-10-CM | POA: Diagnosis not present

## 2022-11-06 DIAGNOSIS — R569 Unspecified convulsions: Secondary | ICD-10-CM | POA: Diagnosis not present

## 2022-11-10 ENCOUNTER — Inpatient Hospital Stay: Payer: Medicaid Other | Attending: Oncology | Admitting: Oncology

## 2022-11-10 ENCOUNTER — Encounter: Payer: Self-pay | Admitting: Oncology

## 2022-11-10 ENCOUNTER — Inpatient Hospital Stay: Payer: Medicaid Other

## 2022-11-10 VITALS — BP 119/85 | HR 89 | Temp 98.9°F | Resp 18 | Wt 208.3 lb

## 2022-11-10 DIAGNOSIS — R718 Other abnormality of red blood cells: Secondary | ICD-10-CM | POA: Diagnosis not present

## 2022-11-10 DIAGNOSIS — D72820 Lymphocytosis (symptomatic): Secondary | ICD-10-CM

## 2022-11-10 DIAGNOSIS — Z79620 Long term (current) use of immunosuppressive biologic: Secondary | ICD-10-CM | POA: Insufficient documentation

## 2022-11-10 DIAGNOSIS — D75839 Thrombocytosis, unspecified: Secondary | ICD-10-CM | POA: Insufficient documentation

## 2022-11-10 DIAGNOSIS — Z79899 Other long term (current) drug therapy: Secondary | ICD-10-CM | POA: Diagnosis not present

## 2022-11-10 DIAGNOSIS — D582 Other hemoglobinopathies: Secondary | ICD-10-CM | POA: Insufficient documentation

## 2022-11-10 DIAGNOSIS — D72829 Elevated white blood cell count, unspecified: Secondary | ICD-10-CM | POA: Insufficient documentation

## 2022-11-10 LAB — COMPREHENSIVE METABOLIC PANEL
ALT: 25 U/L (ref 0–44)
AST: 20 U/L (ref 15–41)
Albumin: 4 g/dL (ref 3.5–5.0)
Alkaline Phosphatase: 78 U/L (ref 38–126)
Anion gap: 8 (ref 5–15)
BUN: 11 mg/dL (ref 6–20)
CO2: 18 mmol/L — ABNORMAL LOW (ref 22–32)
Calcium: 9.2 mg/dL (ref 8.9–10.3)
Chloride: 112 mmol/L — ABNORMAL HIGH (ref 98–111)
Creatinine, Ser: 0.76 mg/dL (ref 0.44–1.00)
GFR, Estimated: >60 mL/min (ref >60)
Glucose, Bld: 94 mg/dL (ref 70–99)
Potassium: 3.9 mmol/L (ref 3.5–5.1)
Sodium: 138 mmol/L (ref 135–145)
Total Bilirubin: 0.8 mg/dL (ref 0.3–1.2)
Total Protein: 7.5 g/dL (ref 6.5–8.1)

## 2022-11-10 LAB — CBC WITH DIFFERENTIAL/PLATELET
Abs Immature Granulocytes: 0.03 10*3/uL (ref 0.00–0.07)
Basophils Absolute: 0.1 10*3/uL (ref 0.0–0.1)
Basophils Relative: 1 %
Eosinophils Absolute: 1.7 10*3/uL — ABNORMAL HIGH (ref 0.0–0.5)
Eosinophils Relative: 17 %
HCT: 41.1 % (ref 36.0–46.0)
Hemoglobin: 14.7 g/dL (ref 12.0–15.0)
Immature Granulocytes: 0 %
Lymphocytes Relative: 31 %
Lymphs Abs: 3 10*3/uL (ref 0.7–4.0)
MCH: 26.9 pg (ref 26.0–34.0)
MCHC: 35.8 g/dL (ref 30.0–36.0)
MCV: 75.3 fL — ABNORMAL LOW (ref 80.0–100.0)
Monocytes Absolute: 0.5 10*3/uL (ref 0.1–1.0)
Monocytes Relative: 5 %
Neutro Abs: 4.5 10*3/uL (ref 1.7–7.7)
Neutrophils Relative %: 46 %
Platelets: 446 10*3/uL — ABNORMAL HIGH (ref 150–400)
RBC: 5.46 MIL/uL — ABNORMAL HIGH (ref 3.87–5.11)
RDW: 14.2 % (ref 11.5–15.5)
WBC: 9.9 10*3/uL (ref 4.0–10.5)
nRBC: 0 % (ref 0.0–0.2)

## 2022-11-10 LAB — HEPATITIS PANEL, ACUTE
HCV Ab: NONREACTIVE
Hep A IgM: NONREACTIVE
Hep B C IgM: NONREACTIVE
Hepatitis B Surface Ag: NONREACTIVE

## 2022-11-10 LAB — RETIC PANEL
Immature Retic Fract: 4.4 % (ref 2.3–15.9)
RBC.: 5.51 MIL/uL — ABNORMAL HIGH (ref 3.87–5.11)
Retic Count, Absolute: 60.6 10*3/uL (ref 19.0–186.0)
Retic Ct Pct: 1.1 % (ref 0.4–3.1)
Reticulocyte Hemoglobin: 30.7 pg (ref >27.9)

## 2022-11-10 LAB — LACTATE DEHYDROGENASE: LDH: 133 U/L (ref 98–192)

## 2022-11-10 LAB — TSH: TSH: 2.451 u[IU]/mL (ref 0.350–4.500)

## 2022-11-10 LAB — HIV ANTIBODY (ROUTINE TESTING W REFLEX): HIV Screen 4th Generation wRfx: NONREACTIVE

## 2022-11-10 LAB — C-REACTIVE PROTEIN: CRP: 0.6 mg/dL (ref <1.0)

## 2022-11-10 LAB — SEDIMENTATION RATE: Sed Rate: 5 mm/hr (ref 0–20)

## 2022-11-10 NOTE — Assessment & Plan Note (Addendum)
Labs reviewed and discussed with patient that Leukocytosis,  can be secondary to infection, chronic inflammation, allergy autoimmune disease, or underlying bone marrow disorders.  For the work up of patient's leukocytosis, I recommend checking CBC;CMP, LDH, smear review, peripheral flowcytometry, hepatitis, HIV, monoclonal gammopathy workup, BCR-ABL, ANA, ESR, CRP

## 2022-11-10 NOTE — Progress Notes (Signed)
Hematology/Oncology Consult Note [phone] [phone]     REFERRING PROVIDER: [facility]   CHIEF COMPLAINTS/REASON FOR VISIT:  Evaluation of leukocytosis  ASSESSMENT & PLAN:   Leukocytosis Labs reviewed and discussed with patient that Leukocytosis,  can be secondary to infection, chronic inflammation, allergy autoimmune disease, or underlying bone marrow disorders.  For the work up of patient's leukocytosis, I recommend checking CBC;CMP, LDH, smear review, peripheral flowcytometry, hepatitis, HIV, monoclonal gammopathy workup, BCR-ABL, ANA, ESR, CRP  RBC microcytosis Previous iron levels not consistent with iron deficiency.  I will repeat iron panel. ?  Hemoglobinopathy.  Will check hemoglobinopathy in future  Thrombocytosis Pending above workup.   Orders Placed This Encounter  Procedures   Comprehensive metabolic panel    Standing Status:   Future    Number of Occurrences:   1    Standing Expiration Date:   11/10/2023   CBC with Differential/Platelet    Standing Status:   Future    Number of Occurrences:   1    Standing Expiration Date:   11/10/2023   Retic Panel    Standing Status:   Future    Number of Occurrences:   1    Standing Expiration Date:   11/10/2023   Flow cytometry panel-leukemia/lymphoma work-up    Standing Status:   Future    Number of Occurrences:   1    Standing Expiration Date:   11/10/2023   Lactate dehydrogenase    Standing Status:   Future    Number of Occurrences:   1    Standing Expiration Date:   11/10/2023   Hepatitis panel, acute    Standing Status:   Future    Number of Occurrences:   1    Standing Expiration Date:   11/10/2023   HIV Antibody (routine testing w rflx)    Standing Status:   Future    Number of Occurrences:   1    Standing Expiration Date:   11/10/2023   BCR-ABL1 FISH    Standing Status:   Future    Number of Occurrences:   1    Standing Expiration Date:   11/10/2023   ANA, IFA (with  reflex)    Standing Status:   Future    Number of Occurrences:   1    Standing Expiration Date:   11/10/2023   TSH    Standing Status:   Future    Number of Occurrences:   1    Standing Expiration Date:   11/10/2023   Protein electrophoresis, serum    Standing Status:   Future    Number of Occurrences:   1    Standing Expiration Date:   11/10/2023   C-reactive protein    Standing Status:   Future    Number of Occurrences:   1    Standing Expiration Date:   11/10/2023   Sedimentation rate    Standing Status:   Future    Number of Occurrences:   1    Standing Expiration Date:   11/10/2023    Return of visit: 3-4 weeks to discuss results.  Cc Callwood, Dwayne D, MD All questions were answered. The patient knows to call the clinic with any problems, questions or concerns.  Rickard Patience, MD, PhD [facility] Hematology Oncology 11/10/2022    HISTORY OF PRESENTING ILLNESS:  April Rush is a  22 y.o.  female with PMH listed below who was referred to me for evaluation of leukocytosis Reviewed  patient' recent labs obtained by PCP.  10/07/2022 CBC showed elevated Rush count of 13.9, hemoglobin 14.6, MCV 77, platelet 166, vitamin B12 330 Iron saturation 20, ferritin 96.  Differential showed increased absolute lymphocyte, eosinophils, basophils. Previous lab records reviewed. Leukocytosis is new onset since March 2024.  Patient had normal Rush count in 2019, 2021, 2023.  She had 1 episode of mild leukocytosis with Rush count of 11.4 on 05/10/2017. 09/03/2014, WBC 13.8.  Patient denies unintentional weight loss, fever, chills.  She feels hot at night but denies any soaking sweat She denies smoking, recreational drug use, chronic oral steroid use.  She uses steroid inhaler twice daily since September 2023 She also has history of rash/hives and she follows up with immunology for allergy shots. Patient noticed nauseated, near syncope episodes.  Mother reports purple fingertips during these  episodes.  She sleeps all the time.  Fatigued.    MEDICAL HISTORY:  Past Medical History:  Diagnosis Date   Asthma    Hypertension    Patient denies medical problems    Tachycardia     SURGICAL HISTORY: Past Surgical History:  Procedure Laterality Date   Denies surgical history      SOCIAL HISTORY: Social History   Socioeconomic History   Marital status: Single    Spouse name: Not on file   Number of children: Not on file   Years of education: Not on file   Highest education level: Not on file  Occupational History   Occupation: student  Tobacco Use   Smoking status: Never   Smokeless tobacco: Never  Vaping Use   Vaping Use: Former   Substances: Nicotine, Flavoring  Substance and Sexual Activity   Alcohol use: Not Currently    Comment: "tried a sip" before   Drug use: Never   Sexual activity: Yes    Partners: Male    Birth control/protection: Injection  Other Topics Concern   Not on file  Social History Narrative   Not on file   Social Determinants of Health   Financial Resource Strain: Not on file  Food Insecurity: No Food Insecurity (11/10/2022)   Hunger Vital Sign    Worried About Running Out of Food in the Last Year: Never true    Ran Out of Food in the Last Year: Never true  Transportation Needs: No Transportation Needs (11/10/2022)   PRAPARE - Administrator, Civil Service (Medical): No    Lack of Transportation (Non-Medical): No  Physical Activity: Not on file  Stress: Not on file  Social Connections: Not on file  Intimate Partner Violence: Not At Risk (11/10/2022)   Humiliation, Afraid, Rape, and Kick questionnaire    Fear of Current or Ex-Partner: No    Emotionally Abused: No    Physically Abused: No    Sexually Abused: No    FAMILY HISTORY: Family History  Problem Relation Age of Onset   Cirrhosis Maternal Grandfather        not related to alcohol   Hypertension Father     ALLERGIES:  is allergic to pollen extract-tree  extract [pollen extract], dog epithelium, and melatonin.  MEDICATIONS:  Current Outpatient Medications  Medication Sig Dispense Refill   azelastine (ASTELIN) 0.1 % nasal spray Place 1 spray into both nostrils 2 (two) times daily. Use in each nostril as directed 30 mL 12   budesonide-formoterol (SYMBICORT) 160-4.5 MCG/ACT inhaler Inhale 2 puffs into the lungs 2 (two) times daily.     cetirizine (ZYRTEC) 10 MG chewable  tablet Chew 10 mg by mouth daily.     montelukast (SINGULAIR) 10 MG tablet Take 10 mg by mouth at bedtime.     nortriptyline (PAMELOR) 10 MG capsule Start Nortriptyline (Pamelor) 10 mg nightly for one week, then increase to 20 mg nightly     Current Facility-Administered Medications  Medication Dose Route Frequency Provider Last Rate Last Admin   medroxyPROGESTERone (DEPO-PROVERA) injection 150 mg  150 mg Intramuscular Q90 days Glenna Fellows, FNP   150 mg at 09/30/22 1610    Review of Systems  Constitutional:  Negative for appetite change, chills, fatigue and fever.  HENT:   Negative for hearing loss and voice change.   Eyes:  Negative for eye problems.  Respiratory:  Negative for chest tightness and cough.   Cardiovascular:  Negative for chest pain.  Gastrointestinal:  Positive for nausea. Negative for abdominal distention, abdominal pain and blood in stool.  Endocrine: Negative for hot flashes.  Genitourinary:  Negative for difficulty urinating and frequency.   Musculoskeletal:  Negative for arthralgias.  Skin:  Positive for rash. Negative for itching.  Neurological:  Positive for light-headedness. Negative for extremity weakness.  Hematological:  Negative for adenopathy.  Psychiatric/Behavioral:  Negative for confusion.     PHYSICAL EXAMINATION:  Vitals:   11/10/22 1120  BP: 119/85  Pulse: 89  Resp: 18  Temp: 98.9 F (37.2 C)   Filed Weights   11/10/22 1120  Weight: 208 lb 4.8 oz (94.5 kg)    Physical Exam Constitutional:      General: She is not in acute  distress. HENT:     Head: Normocephalic and atraumatic.  Eyes:     General: No scleral icterus. Cardiovascular:     Rate and Rhythm: Normal rate and regular rhythm.     Heart sounds: Normal heart sounds.  Pulmonary:     Effort: Pulmonary effort is normal. No respiratory distress.     Breath sounds: No wheezing.  Abdominal:     General: Bowel sounds are normal. There is no distension.     Palpations: Abdomen is soft.  Musculoskeletal:        General: No deformity. Normal range of motion.     Cervical back: Normal range of motion and neck supple.  Skin:    General: Skin is warm and dry.     Findings: No erythema or rash.  Neurological:     Mental Status: She is alert and oriented to person, place, and time. Mental status is at baseline.     Cranial Nerves: No cranial nerve deficit.     Coordination: Coordination normal.  Psychiatric:        Mood and Affect: Mood normal.        RADIOGRAPHIC STUDIES: I have personally reviewed the radiological images as listed and agreed with the findings in the report. MR BRAIN WO CONTRAST  Result Date: 11/05/2022 CLINICAL DATA:  Syncope. EXAM: MRI HEAD WITHOUT CONTRAST TECHNIQUE: Multiplanar, multiecho pulse sequences of the brain and surrounding structures were obtained without intravenous contrast. COMPARISON:  None Available. FINDINGS: Brain: No acute infarction, hemorrhage, hydrocephalus, extra-axial collection or mass lesion. Vascular: Major arterial flow voids are maintained. Skull and upper cervical spine: Normal marrow signal. Sinuses/Orbits: Severe paranasal sinus mucosal thickening. Other: No mastoid effusions.  No evidence of acute IMPRESSION: 1. No evidence of intracranial abnormality. 2. Severe paranasal sinus mucosal thickening. Electronically Signed   By: Feliberto Harts M.D.   On: 11/05/2022 16:47    LABORATORY DATA:  I have  reviewed the data as listed    Latest Ref Rng & Units 11/10/2022   11:49 AM 08/25/2022    5:51 PM  04/26/2022    9:41 PM  CBC  WBC 4.0 - 10.5 K/uL 9.9  14.4  7.1   Hemoglobin 12.0 - 15.0 g/dL 81.1  91.4  78.2   Hematocrit 36.0 - 46.0 % 41.1  42.3  40.1   Platelets 150 - 400 K/uL 446  445  383       Latest Ref Rng & Units 11/10/2022   11:49 AM 08/25/2022    5:51 PM 04/26/2022    9:41 PM  CMP  Glucose 70 - 99 mg/dL 94  956  213   BUN 6 - 20 mg/dL 11  11  9    Creatinine 0.44 - 1.00 mg/dL 0.86  5.78  4.69   Sodium 135 - 145 mmol/L 138  139  136   Potassium 3.5 - 5.1 mmol/L 3.9  3.9  3.6   Chloride 98 - 111 mmol/L 112  112  107   CO2 22 - 32 mmol/L 18  18  20    Calcium 8.9 - 10.3 mg/dL 9.2  7.8  9.0   Total Protein 6.5 - 8.1 g/dL 7.5     Total Bilirubin 0.3 - 1.2 mg/dL 0.8     Alkaline Phos 38 - 126 U/L 78     AST 15 - 41 U/L 20     ALT 0 - 44 U/L 25

## 2022-11-10 NOTE — Assessment & Plan Note (Addendum)
Previous iron levels not consistent with iron deficiency.  I will repeat iron panel. ?  Hemoglobinopathy.  Will check hemoglobinopathy in future

## 2022-11-10 NOTE — Assessment & Plan Note (Signed)
Pending above work up 

## 2022-11-11 ENCOUNTER — Other Ambulatory Visit: Payer: Self-pay

## 2022-11-11 DIAGNOSIS — D72820 Lymphocytosis (symptomatic): Secondary | ICD-10-CM

## 2022-11-12 LAB — COMP PANEL: LEUKEMIA/LYMPHOMA

## 2022-11-13 LAB — ANTINUCLEAR ANTIBODIES, IFA: ANA Ab, IFA: NEGATIVE

## 2022-11-15 DIAGNOSIS — R569 Unspecified convulsions: Secondary | ICD-10-CM | POA: Diagnosis not present

## 2022-11-15 LAB — PROTEIN ELECTROPHORESIS, SERUM
A/G Ratio: 1.1 (ref 0.7–1.7)
Albumin ELP: 3.7 g/dL (ref 2.9–4.4)
Alpha-1-Globulin: 0.2 g/dL (ref 0.0–0.4)
Alpha-2-Globulin: 0.9 g/dL (ref 0.4–1.0)
Beta Globulin: 1.1 g/dL (ref 0.7–1.3)
Gamma Globulin: 1.1 g/dL (ref 0.4–1.8)
Globulin, Total: 3.3 g/dL (ref 2.2–3.9)
Total Protein ELP: 7 g/dL (ref 6.0–8.5)

## 2022-11-16 DIAGNOSIS — R42 Dizziness and giddiness: Secondary | ICD-10-CM | POA: Diagnosis not present

## 2022-11-16 DIAGNOSIS — J309 Allergic rhinitis, unspecified: Secondary | ICD-10-CM | POA: Diagnosis not present

## 2022-11-16 DIAGNOSIS — J4540 Moderate persistent asthma, uncomplicated: Secondary | ICD-10-CM | POA: Diagnosis not present

## 2022-11-16 DIAGNOSIS — E7800 Pure hypercholesterolemia, unspecified: Secondary | ICD-10-CM | POA: Diagnosis not present

## 2022-11-16 DIAGNOSIS — R55 Syncope and collapse: Secondary | ICD-10-CM | POA: Diagnosis not present

## 2022-11-16 DIAGNOSIS — G901 Familial dysautonomia [Riley-Day]: Secondary | ICD-10-CM | POA: Diagnosis not present

## 2022-11-16 DIAGNOSIS — E669 Obesity, unspecified: Secondary | ICD-10-CM | POA: Diagnosis not present

## 2022-11-16 DIAGNOSIS — R0989 Other specified symptoms and signs involving the circulatory and respiratory systems: Secondary | ICD-10-CM | POA: Diagnosis not present

## 2022-11-16 LAB — BCR-ABL1 FISH
Cells Analyzed: 200
Cells Counted: 200

## 2022-11-19 DIAGNOSIS — J3081 Allergic rhinitis due to animal (cat) (dog) hair and dander: Secondary | ICD-10-CM | POA: Diagnosis not present

## 2022-11-19 DIAGNOSIS — J3089 Other allergic rhinitis: Secondary | ICD-10-CM | POA: Diagnosis not present

## 2022-11-19 DIAGNOSIS — J301 Allergic rhinitis due to pollen: Secondary | ICD-10-CM | POA: Diagnosis not present

## 2022-11-26 DIAGNOSIS — J3081 Allergic rhinitis due to animal (cat) (dog) hair and dander: Secondary | ICD-10-CM | POA: Diagnosis not present

## 2022-11-26 DIAGNOSIS — J3089 Other allergic rhinitis: Secondary | ICD-10-CM | POA: Diagnosis not present

## 2022-11-26 DIAGNOSIS — J301 Allergic rhinitis due to pollen: Secondary | ICD-10-CM | POA: Diagnosis not present

## 2022-12-07 DIAGNOSIS — J3081 Allergic rhinitis due to animal (cat) (dog) hair and dander: Secondary | ICD-10-CM | POA: Diagnosis not present

## 2022-12-07 DIAGNOSIS — R55 Syncope and collapse: Secondary | ICD-10-CM | POA: Diagnosis not present

## 2022-12-07 DIAGNOSIS — R42 Dizziness and giddiness: Secondary | ICD-10-CM | POA: Diagnosis not present

## 2022-12-07 DIAGNOSIS — G44009 Cluster headache syndrome, unspecified, not intractable: Secondary | ICD-10-CM | POA: Diagnosis not present

## 2022-12-07 DIAGNOSIS — J3089 Other allergic rhinitis: Secondary | ICD-10-CM | POA: Diagnosis not present

## 2022-12-07 DIAGNOSIS — R110 Nausea: Secondary | ICD-10-CM | POA: Diagnosis not present

## 2022-12-07 DIAGNOSIS — J301 Allergic rhinitis due to pollen: Secondary | ICD-10-CM | POA: Diagnosis not present

## 2022-12-08 ENCOUNTER — Encounter: Payer: Self-pay | Admitting: Oncology

## 2022-12-08 ENCOUNTER — Inpatient Hospital Stay: Payer: Medicaid Other

## 2022-12-08 ENCOUNTER — Inpatient Hospital Stay: Payer: Medicaid Other | Attending: Oncology | Admitting: Oncology

## 2022-12-08 VITALS — BP 118/89 | HR 92 | Temp 97.3°F | Wt 206.3 lb

## 2022-12-08 DIAGNOSIS — D721 Eosinophilia, unspecified: Secondary | ICD-10-CM

## 2022-12-08 DIAGNOSIS — D72829 Elevated white blood cell count, unspecified: Secondary | ICD-10-CM | POA: Insufficient documentation

## 2022-12-08 DIAGNOSIS — D75839 Thrombocytosis, unspecified: Secondary | ICD-10-CM | POA: Insufficient documentation

## 2022-12-08 DIAGNOSIS — Z79899 Other long term (current) drug therapy: Secondary | ICD-10-CM | POA: Insufficient documentation

## 2022-12-08 DIAGNOSIS — R718 Other abnormality of red blood cells: Secondary | ICD-10-CM | POA: Insufficient documentation

## 2022-12-08 LAB — TROPONIN I (HIGH SENSITIVITY): Troponin I (High Sensitivity): <2 ng/L (ref <18)

## 2022-12-08 NOTE — Assessment & Plan Note (Addendum)
Labs are reviewed and discussed with patient. Patient has normal LDH, peripheral flowcytometry immunophenotypic abnormality, negative hepatitis, negative HIV, no M protein on monoclonal gammopathy workup, negative BCR-ABL, normal ANA, ESR, CRP She has eosinophilia, most like due to chronic inflammation, allergy, chronic sinusitis.  I will check tryptase, JAK2 mutation with reflex, PDGFRA, PDGFRB, and FGFR1

## 2022-12-08 NOTE — Assessment & Plan Note (Signed)
Pending above work up 

## 2022-12-08 NOTE — Progress Notes (Signed)
Hematology/Oncology Consult Note [phone] [phone]     REFERRING PROVIDER: [facility]   CHIEF COMPLAINTS/REASON FOR VISIT:  leukocytosis  ASSESSMENT & PLAN:   Leukocytosis Labs are reviewed and discussed with patient. Patient has normal LDH, peripheral flowcytometry immunophenotypic abnormality, negative hepatitis, negative HIV, no M protein on monoclonal gammopathy workup, negative BCR-ABL, normal ANA, ESR, CRP She has eosinophilia, most like due to chronic inflammation, allergy, chronic sinusitis.  I will check tryptase, JAK2 mutation with reflex, PDGFRA, PDGFRB, and FGFR1    RBC microcytosis Previous iron levels not consistent with iron deficiency.  I will repeat iron panel. ?  Hemoglobinopathy.   check hemoglobinopathy  Thrombocytosis Pending above workup.   Orders Placed This Encounter  Procedures   Tryptase    Standing Status:   Future    Number of Occurrences:   1    Standing Expiration Date:   12/08/2023   Hgb Fractionation [place]    Standing Status:   Future    Number of Occurrences:   1    Standing Expiration Date:   12/08/2023   Miscellaneous [facility] test (send-out)    Standing Status:   Future    Number of Occurrences:   1    Standing Expiration Date:   12/08/2023    Order Specific Question:   Test name / description:    Answer:   MPN/HES FISH [facility] [id number]   JAK2 [id number] rfx CALR/MPL/E12-15    Standing Status:   Future    Number of Occurrences:   1    Standing Expiration Date:   12/08/2023    Return of visit: To be determined. Cc [facility] All questions were answered. The patient knows to call the clinic with any problems, questions or concerns.  Rickard Patience, MD, PhD [facility] Hematology Oncology 12/08/2022    HISTORY OF PRESENTING ILLNESS:  April Rush is a  22 y.o.  female with PMH listed below who was referred to me for evaluation of leukocytosis Reviewed patient' recent labs obtained by PCP.   10/07/2022 CBC showed elevated white count of 13.9, hemoglobin 14.6, MCV 77, platelet 166, vitamin B12 330 Iron saturation 20, ferritin 96.  Differential showed increased absolute lymphocyte, eosinophils, basophils. Previous lab records reviewed. Leukocytosis is new onset since March 2024.  Patient had normal white count in 2019, 2021, 2023.  She had 1 episode of mild leukocytosis with white count of 11.4 on 05/10/2017. 09/03/2014, WBC 13.8.  Patient denies unintentional weight loss, fever, chills.  She feels hot at night but denies any soaking sweat She denies smoking, recreational drug use, chronic oral steroid use.  She uses steroid inhaler twice daily since September 2023 She also has history of rash/hives and she follows up with immunology for allergy shots. Patient noticed nauseated, near syncope episodes.  Mother reports purple fingertips during these episodes.  She sleeps all the time.  Fatigued.   INTERVAL HISTORY April Rush is a 22 y.o. female who has above history reviewed by me today presents for follow up visit for eosinophilia, thrombocytosis and RBC microcytosis. Patient has multiple complaints.  She reports and shakes, really fatigued.  Dizzy and passing out episodes. She feels that these new symptoms has replaced her old symptoms. No unintentional weight loss, night sweats or fever. + nasal congestion.  11/05/2022 MRI brain without contrast showed no evidence of intracranial abnormality.  Severe paranasal sinus mucosal thickening.  MEDICAL HISTORY:  Past Medical History:  Diagnosis Date   Asthma  Hypertension    Patient denies medical problems    Tachycardia     SURGICAL HISTORY: Past Surgical History:  Procedure Laterality Date   Denies surgical history      SOCIAL HISTORY: Social History   Socioeconomic History   Marital status: Single    Spouse name: Not on file   Number of children: Not on file   Years of education: Not on file   Highest education  level: Not on file  Occupational History   Occupation: student  Tobacco Use   Smoking status: Never   Smokeless tobacco: Never  Vaping Use   Vaping Use: Former   Substances: Nicotine, Flavoring  Substance and Sexual Activity   Alcohol use: Not Currently    Comment: "tried a sip" before   Drug use: Never   Sexual activity: Yes    Partners: Male    Birth control/protection: Injection  Other Topics Concern   Not on file  Social History Narrative   Not on file   Social Determinants of Health   Financial Resource Strain: Not on file  Food Insecurity: No Food Insecurity (11/10/2022)   Hunger Vital Sign    Worried About Running Out of Food in the Last Year: Never true    Ran Out of Food in the Last Year: Never true  Transportation Needs: No Transportation Needs (11/10/2022)   PRAPARE - Administrator, Civil Service (Medical): No    Lack of Transportation (Non-Medical): No  Physical Activity: Not on file  Stress: Not on file  Social Connections: Not on file  Intimate Partner Violence: Not At Risk (11/10/2022)   Humiliation, Afraid, Rape, and Kick questionnaire    Fear of Current or Ex-Partner: No    Emotionally Abused: No    Physically Abused: No    Sexually Abused: No    FAMILY HISTORY: Family History  Problem Relation Age of Onset   Cirrhosis Maternal Grandfather        not related to alcohol   Hypertension Father     ALLERGIES:  is allergic to pollen extract-tree extract [pollen extract], dog epithelium, and melatonin.  MEDICATIONS:  Current Outpatient Medications  Medication Sig Dispense Refill   azelastine (ASTELIN) 0.1 % nasal spray Place 1 spray into both nostrils 2 (two) times daily. Use in each nostril as directed 30 mL 12   budesonide-formoterol (SYMBICORT) 160-4.5 MCG/ACT inhaler Inhale 2 puffs into the lungs 2 (two) times daily.     cetirizine (ZYRTEC) 10 MG chewable tablet Chew 10 mg by mouth daily.     escitalopram (LEXAPRO) 10 MG tablet Take  10 mg by mouth daily.     montelukast (SINGULAIR) 10 MG tablet Take 10 mg by mouth at bedtime.     nortriptyline (PAMELOR) 10 MG capsule Start Nortriptyline (Pamelor) 10 mg nightly for one week, then increase to 20 mg nightly     Current Facility-Administered Medications  Medication Dose Route Frequency Provider Last Rate Last Admin   medroxyPROGESTERone (DEPO-PROVERA) injection 150 mg  150 mg Intramuscular Q90 days Glenna Fellows, FNP   150 mg at 09/30/22 1610    Review of Systems  Constitutional:  Negative for appetite change, chills, fatigue and fever.  HENT:   Negative for hearing loss and voice change.   Eyes:  Negative for eye problems.  Respiratory:  Negative for chest tightness and cough.   Cardiovascular:  Negative for chest pain.  Gastrointestinal:  Positive for nausea. Negative for abdominal distention, abdominal pain and blood in  stool.  Endocrine: Negative for hot flashes.  Genitourinary:  Negative for difficulty urinating and frequency.   Musculoskeletal:  Negative for arthralgias.  Skin:  Positive for rash. Negative for itching.  Neurological:  Positive for light-headedness. Negative for extremity weakness.  Hematological:  Negative for adenopathy.  Psychiatric/Behavioral:  Negative for confusion.     PHYSICAL EXAMINATION:  Vitals:   12/08/22 1418  BP: 118/89  Pulse: 92  Temp: (!) 97.3 F (36.3 C)  SpO2: 97%   Filed Weights   12/08/22 1418  Weight: 206 lb 4.8 oz (93.6 kg)    Physical Exam Constitutional:      General: She is not in acute distress. HENT:     Head: Normocephalic and atraumatic.  Eyes:     General: No scleral icterus. Cardiovascular:     Rate and Rhythm: Normal rate and regular rhythm.     Heart sounds: Normal heart sounds.  Pulmonary:     Effort: Pulmonary effort is normal. No respiratory distress.     Breath sounds: No wheezing.  Abdominal:     General: Bowel sounds are normal. There is no distension.     Palpations: Abdomen is soft.   Musculoskeletal:        General: No deformity. Normal range of motion.     Cervical back: Normal range of motion and neck supple.  Skin:    General: Skin is warm and dry.     Findings: No erythema or rash.  Neurological:     Mental Status: She is alert and oriented to person, place, and time. Mental status is at baseline.     Cranial Nerves: No cranial nerve deficit.     Coordination: Coordination normal.  Psychiatric:        Mood and Affect: Mood normal.        RADIOGRAPHIC STUDIES: I have personally reviewed the radiological images as listed and agreed with the findings in the report. No results found.  LABORATORY DATA:  I have reviewed the data as listed    Latest Ref Rng & Units 11/10/2022   11:49 AM 08/25/2022    5:51 PM 04/26/2022    9:41 PM  CBC  WBC 4.0 - 10.5 K/uL 9.9  14.4  7.1   Hemoglobin 12.0 - 15.0 g/dL 19.1  47.8  29.5   Hematocrit 36.0 - 46.0 % 41.1  42.3  40.1   Platelets 150 - 400 K/uL 446  445  383       Latest Ref Rng & Units 11/10/2022   11:49 AM 08/25/2022    5:51 PM 04/26/2022    9:41 PM  CMP  Glucose 70 - 99 mg/dL 94  621  308   BUN 6 - 20 mg/dL 11  11  9    Creatinine 0.44 - 1.00 mg/dL 6.57  8.46  9.62   Sodium 135 - 145 mmol/L 138  139  136   Potassium 3.5 - 5.1 mmol/L 3.9  3.9  3.6   Chloride 98 - 111 mmol/L 112  112  107   CO2 22 - 32 mmol/L 18  18  20    Calcium 8.9 - 10.3 mg/dL 9.2  7.8  9.0   Total Protein 6.5 - 8.1 g/dL 7.5     Total Bilirubin 0.3 - 1.2 mg/dL 0.8     Alkaline Phos 38 - 126 U/L 78     AST 15 - 41 U/L 20     ALT 0 - 44 U/L 25

## 2022-12-08 NOTE — Progress Notes (Signed)
Patient's symptoms have worsened along some new ones; like her hands get really shaky, and whenever the symptoms occur she says that she will lie down on the floor to try and calm herself down.

## 2022-12-08 NOTE — Assessment & Plan Note (Addendum)
Previous iron levels not consistent with iron deficiency.  I will repeat iron panel. ?  Hemoglobinopathy.   check hemoglobinopathy

## 2022-12-10 LAB — TRYPTASE: Tryptase: 2.9 ug/L (ref 2.2–13.2)

## 2022-12-11 LAB — MISC LABCORP TEST (SEND OUT): Labcorp test code: 511444

## 2022-12-14 LAB — HGB FRACTIONATION CASCADE

## 2022-12-14 LAB — HGB FRACTIONATION BY HPLC
Hgb A2: 3.3 % — ABNORMAL HIGH (ref 1.8–3.2)
Hgb A: 61.7 % — ABNORMAL LOW (ref 96.4–98.8)
Hgb C: 35 % — ABNORMAL HIGH
Hgb E: 0 %
Hgb F: 0 % (ref 0.0–2.0)
Hgb S: 0 %
Hgb Variant: 0 %

## 2022-12-16 ENCOUNTER — Ambulatory Visit (LOCAL_COMMUNITY_HEALTH_CENTER): Payer: Medicaid Other

## 2022-12-16 VITALS — BP 128/89 | Ht 62.0 in | Wt 207.0 lb

## 2022-12-16 DIAGNOSIS — Z3042 Encounter for surveillance of injectable contraceptive: Secondary | ICD-10-CM | POA: Diagnosis not present

## 2022-12-16 DIAGNOSIS — Z309 Encounter for contraceptive management, unspecified: Secondary | ICD-10-CM

## 2022-12-16 DIAGNOSIS — J3089 Other allergic rhinitis: Secondary | ICD-10-CM | POA: Diagnosis not present

## 2022-12-16 DIAGNOSIS — J301 Allergic rhinitis due to pollen: Secondary | ICD-10-CM | POA: Diagnosis not present

## 2022-12-16 DIAGNOSIS — J3081 Allergic rhinitis due to animal (cat) (dog) hair and dander: Secondary | ICD-10-CM | POA: Diagnosis not present

## 2022-12-16 DIAGNOSIS — Z3009 Encounter for other general counseling and advice on contraception: Secondary | ICD-10-CM

## 2022-12-16 NOTE — Progress Notes (Signed)
[redacted]w[redacted]d post depo. Voices no concerns. Depo given today per order by A. White, FNP dated 04/26/2022. Tolerated well in RUOQ. Next depo due 03/03/2023; patient aware.   Abagail Kitchens, RN

## 2022-12-19 LAB — CALR +MPL + E12-E15  (REFLEX)

## 2022-12-19 LAB — JAK2 V617F RFX CALR/MPL/E12-15

## 2022-12-28 DIAGNOSIS — J301 Allergic rhinitis due to pollen: Secondary | ICD-10-CM | POA: Diagnosis not present

## 2022-12-28 DIAGNOSIS — J3081 Allergic rhinitis due to animal (cat) (dog) hair and dander: Secondary | ICD-10-CM | POA: Diagnosis not present

## 2022-12-28 DIAGNOSIS — J3089 Other allergic rhinitis: Secondary | ICD-10-CM | POA: Diagnosis not present

## 2023-01-14 DIAGNOSIS — J301 Allergic rhinitis due to pollen: Secondary | ICD-10-CM | POA: Diagnosis not present

## 2023-01-14 DIAGNOSIS — J3089 Other allergic rhinitis: Secondary | ICD-10-CM | POA: Diagnosis not present

## 2023-01-14 DIAGNOSIS — J3081 Allergic rhinitis due to animal (cat) (dog) hair and dander: Secondary | ICD-10-CM | POA: Diagnosis not present

## 2023-01-17 ENCOUNTER — Telehealth: Payer: Self-pay

## 2023-01-17 NOTE — Telephone Encounter (Signed)
-----   Message from Rickard Patience sent at 01/15/2023 10:54 AM EDT ----- Please arrange patient to follow up, MD only in the next 1-3 weeks. Thanks. Non urgent. thanks

## 2023-01-18 DIAGNOSIS — R42 Dizziness and giddiness: Secondary | ICD-10-CM | POA: Diagnosis not present

## 2023-01-18 DIAGNOSIS — J3081 Allergic rhinitis due to animal (cat) (dog) hair and dander: Secondary | ICD-10-CM | POA: Diagnosis not present

## 2023-01-18 DIAGNOSIS — G44009 Cluster headache syndrome, unspecified, not intractable: Secondary | ICD-10-CM | POA: Diagnosis not present

## 2023-01-18 DIAGNOSIS — J301 Allergic rhinitis due to pollen: Secondary | ICD-10-CM | POA: Diagnosis not present

## 2023-01-18 DIAGNOSIS — R110 Nausea: Secondary | ICD-10-CM | POA: Diagnosis not present

## 2023-01-18 DIAGNOSIS — R55 Syncope and collapse: Secondary | ICD-10-CM | POA: Diagnosis not present

## 2023-01-18 DIAGNOSIS — J3089 Other allergic rhinitis: Secondary | ICD-10-CM | POA: Diagnosis not present

## 2023-01-18 DIAGNOSIS — R5383 Other fatigue: Secondary | ICD-10-CM | POA: Diagnosis not present

## 2023-01-18 DIAGNOSIS — R569 Unspecified convulsions: Secondary | ICD-10-CM | POA: Diagnosis not present

## 2023-01-27 DIAGNOSIS — J3081 Allergic rhinitis due to animal (cat) (dog) hair and dander: Secondary | ICD-10-CM | POA: Diagnosis not present

## 2023-01-27 DIAGNOSIS — J301 Allergic rhinitis due to pollen: Secondary | ICD-10-CM | POA: Diagnosis not present

## 2023-01-27 DIAGNOSIS — J3089 Other allergic rhinitis: Secondary | ICD-10-CM | POA: Diagnosis not present

## 2023-02-01 DIAGNOSIS — J3089 Other allergic rhinitis: Secondary | ICD-10-CM | POA: Diagnosis not present

## 2023-02-01 DIAGNOSIS — J3081 Allergic rhinitis due to animal (cat) (dog) hair and dander: Secondary | ICD-10-CM | POA: Diagnosis not present

## 2023-02-01 DIAGNOSIS — J301 Allergic rhinitis due to pollen: Secondary | ICD-10-CM | POA: Diagnosis not present

## 2023-02-04 DIAGNOSIS — R569 Unspecified convulsions: Secondary | ICD-10-CM | POA: Diagnosis not present

## 2023-02-05 DIAGNOSIS — R569 Unspecified convulsions: Secondary | ICD-10-CM | POA: Diagnosis not present

## 2023-02-08 DIAGNOSIS — J3089 Other allergic rhinitis: Secondary | ICD-10-CM | POA: Diagnosis not present

## 2023-02-08 DIAGNOSIS — J301 Allergic rhinitis due to pollen: Secondary | ICD-10-CM | POA: Diagnosis not present

## 2023-02-08 DIAGNOSIS — J3081 Allergic rhinitis due to animal (cat) (dog) hair and dander: Secondary | ICD-10-CM | POA: Diagnosis not present

## 2023-02-09 ENCOUNTER — Encounter: Payer: Self-pay | Admitting: Oncology

## 2023-02-09 ENCOUNTER — Inpatient Hospital Stay: Payer: Medicaid Other | Attending: Oncology | Admitting: Oncology

## 2023-02-09 VITALS — BP 121/93 | HR 102 | Temp 99.5°F | Resp 18 | Wt 203.4 lb

## 2023-02-09 DIAGNOSIS — D72829 Elevated white blood cell count, unspecified: Secondary | ICD-10-CM | POA: Diagnosis not present

## 2023-02-09 DIAGNOSIS — Z79899 Other long term (current) drug therapy: Secondary | ICD-10-CM | POA: Diagnosis not present

## 2023-02-09 DIAGNOSIS — D75829 Heparin-induced thrombocytopenia, unspecified: Secondary | ICD-10-CM | POA: Diagnosis not present

## 2023-02-09 DIAGNOSIS — D75839 Thrombocytosis, unspecified: Secondary | ICD-10-CM | POA: Diagnosis not present

## 2023-02-09 DIAGNOSIS — D582 Other hemoglobinopathies: Secondary | ICD-10-CM

## 2023-02-09 NOTE — Assessment & Plan Note (Signed)
Likely reactive 

## 2023-02-09 NOTE — Assessment & Plan Note (Addendum)
Labs are reviewed and discussed with patient. Patient has normal LDH, peripheral flowcytometry immunophenotypic abnormality, negative hepatitis, negative HIV, no M protein on monoclonal gammopathy workup, negative BCR-ABL, normal ANA, ESR, CRP   Normal tryptase, negative JAK2 mutation with reflex, negative CHIC2 or rearrangement of PDGFRA, PDGFRB, or FGFR1,  Eosinophilia, most like due to chronic inflammation, allergy, chronic sinusitis. Recommend observation.

## 2023-02-09 NOTE — Assessment & Plan Note (Signed)
No intervention needed.  Recommend genetic counseling when she plans family

## 2023-02-09 NOTE — Progress Notes (Signed)
Hematology/Oncology Progress note [phone] [phone]        REFERRING PROVIDER: [facility]   CHIEF COMPLAINTS/REASON FOR VISIT:  Leukocytosis, thrombocytosis  ASSESSMENT & PLAN:   Leukocytosis Labs are reviewed and discussed with patient. Patient has normal LDH, peripheral flowcytometry immunophenotypic abnormality, negative hepatitis, negative HIV, no M protein on monoclonal gammopathy workup, negative BCR-ABL, normal ANA, ESR, CRP   Normal tryptase, negative JAK2 mutation with reflex, negative CHIC2 or rearrangement of PDGFRA, PDGFRB, or FGFR1,  Eosinophilia, most like due to chronic inflammation, allergy, chronic sinusitis. Recommend observation.   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  Recommend genetic counseling when she plans family  Thrombocytosis Likely reactive   Orders Placed This Encounter  Procedures   CBC with Differential (Cancer Center Only)    Standing Status:   Future    Standing Expiration Date:   02/09/2024    Return of visit: follow up in 6 months [facility] All questions were answered. The patient knows to call the clinic with any problems, questions or concerns.  Rickard Patience, MD, PhD [facility] Hematology Oncology 02/09/2023    HISTORY OF PRESENTING ILLNESS:  April Rush is a  22 y.o.  female with PMH listed below who was referred to me for evaluation of leukocytosis Reviewed patient' recent labs obtained by PCP.  10/07/2022 CBC showed elevated white count of 13.9, hemoglobin 14.6, MCV 77, platelet 166, vitamin B12 330 Iron saturation 20, ferritin 96.  Differential showed increased absolute lymphocyte, eosinophils, basophils. Previous lab records reviewed. Leukocytosis is new onset since March 2024.  Patient had normal white count in 2019, 2021, 2023.  She had 1 episode of mild leukocytosis with white count of 11.4 on 05/10/2017. 09/03/2014, WBC 13.8.  Patient denies unintentional  weight loss, fever, chills.  She feels hot at night but denies any soaking sweat She denies smoking, recreational drug use, chronic oral steroid use.  She uses steroid inhaler twice daily since September 2023 She also has history of rash/hives and she follows up with immunology for allergy shots. Patient noticed nauseated, near syncope episodes.  Mother reports purple fingertips during these episodes.  She sleeps all the time.  Fatigued.   multiple complaints.  She reports and shakes, really fatigued.  Dizzy and passing out episodes. She feels that these new symptoms has replaced her old symptoms. No unintentional weight loss, night sweats or fever. + nasal congestion.  11/05/2022 MRI brain without contrast showed no evidence of intracranial abnormality.  Severe paranasal sinus mucosal thickening.  INTERVAL HISTORY April Rush is a 22 y.o. female who has above history reviewed by me today presents for follow up visit for eosinophilia, thrombocytosis and RBC microcytosis. She presents to discuss results.   MEDICAL HISTORY:  Past Medical History:  Diagnosis Date   Asthma    Hypertension    Patient denies medical problems    Tachycardia     SURGICAL HISTORY: Past Surgical History:  Procedure Laterality Date   Denies surgical history      SOCIAL HISTORY: Social History   Socioeconomic History   Marital status: Single    Spouse name: Not on file   Number of children: Not on file   Years of education: Not on file   Highest education level: Not on file  Occupational History   Occupation: student  Tobacco Use   Smoking status: Never   Smokeless tobacco: Never  Vaping Use   Vaping status: Former   Substances: Nicotine,  Flavoring  Substance and Sexual Activity   Alcohol use: Not Currently    Comment: "tried a sip" before   Drug use: Never   Sexual activity: Yes    Partners: Male    Birth control/protection: Injection  Other Topics Concern   Not on file  Social History  Narrative   Not on file   Social Determinants of Health   Financial Resource Strain: Low Risk  (09/30/2022)   [facility]   Overall Financial Resource Strain (CARDIA)    Difficulty of Paying Living Expenses: Not hard at all  Food Insecurity: No Food Insecurity (11/10/2022)   Hunger Vital Sign    Worried About Running Out of Food in the Last Year: Never true    Ran Out of Food in the Last Year: Never true  Transportation Needs: No Transportation Needs (11/10/2022)   PRAPARE - Administrator, Civil Service (Medical): No    Lack of Transportation (Non-Medical): No  Physical Activity: Not on file  Stress: Not on file  Social Connections: Not on file  Intimate Partner Violence: Not At Risk (11/10/2022)   Humiliation, Afraid, Rape, and Kick questionnaire    Fear of Current or Ex-Partner: No    Emotionally Abused: No    Physically Abused: No    Sexually Abused: No    FAMILY HISTORY: Family History  Problem Relation Age of Onset   Cirrhosis Maternal Grandfather        not related to alcohol   Hypertension Father     ALLERGIES:  is allergic to pollen extract-tree extract [pollen extract], dog epithelium, and melatonin.  MEDICATIONS:  Current Outpatient Medications  Medication Sig Dispense Refill   azelastine (ASTELIN) 0.1 % nasal spray Place 1 spray into both nostrils 2 (two) times daily. Use in each nostril as directed 30 mL 12   budesonide-formoterol (SYMBICORT) 160-4.5 MCG/ACT inhaler Inhale 2 puffs into the lungs 2 (two) times daily.     nortriptyline (PAMELOR) 10 MG capsule Start Nortriptyline (Pamelor) 10 mg nightly for one week, then increase to 20 mg nightly     escitalopram (LEXAPRO) 10 MG tablet Take 10 mg by mouth daily. (Patient not taking: Reported on 02/09/2023)     Current Facility-Administered Medications  Medication Dose Route Frequency Provider Last Rate Last Admin   medroxyPROGESTERone  (DEPO-PROVERA) injection 150 mg  150 mg Intramuscular Q90 days Glenna Fellows, NP   150 mg at 12/16/22 1449    Review of Systems  Constitutional:  Negative for appetite change, chills, fatigue and fever.  HENT:   Negative for hearing loss and voice change.   Eyes:  Negative for eye problems.  Respiratory:  Negative for chest tightness and cough.   Cardiovascular:  Negative for chest pain.  Gastrointestinal:  Positive for nausea. Negative for abdominal distention, abdominal pain and blood in stool.  Endocrine: Negative for hot flashes.  Genitourinary:  Negative for difficulty urinating and frequency.   Musculoskeletal:  Negative for arthralgias.  Skin:  Positive for rash. Negative for itching.  Neurological:  Positive for light-headedness. Negative for extremity weakness.  Hematological:  Negative for adenopathy.  Psychiatric/Behavioral:  Negative for confusion.     PHYSICAL EXAMINATION:  Vitals:   02/09/23 1457  BP: (!) 121/93  Pulse: (!) 102  Resp: 18  Temp: 99.5 F (37.5 C)   Filed Weights   02/09/23 1457  Weight: 203 lb 6.4 oz (92.3 kg)    Physical Exam Constitutional:  General: She is not in acute distress. HENT:     Head: Normocephalic and atraumatic.  Eyes:     General: No scleral icterus. Cardiovascular:     Rate and Rhythm: Normal rate and regular rhythm.     Heart sounds: Normal heart sounds.  Pulmonary:     Effort: Pulmonary effort is normal. No respiratory distress.  Abdominal:     General: There is no distension.  Musculoskeletal:        General: Normal range of motion.     Cervical back: Normal range of motion and neck supple.  Skin:    Findings: No rash.  Neurological:     Mental Status: She is alert and oriented to person, place, and time. Mental status is at baseline.  Psychiatric:        Mood and Affect: Mood normal.      RADIOGRAPHIC STUDIES: I have personally reviewed the radiological images as listed and agreed with the findings in the  report. No results found.  LABORATORY DATA:  I have reviewed the data as listed    Latest Ref Rng & Units 11/10/2022   11:49 AM 08/25/2022    5:51 PM 04/26/2022    9:41 PM  CBC  WBC 4.0 - 10.5 K/uL 9.9  14.4  7.1   Hemoglobin 12.0 - 15.0 g/dL 28.4  13.2  44.0   Hematocrit 36.0 - 46.0 % 41.1  42.3  40.1   Platelets 150 - 400 K/uL 446  445  383       Latest Ref Rng & Units 11/10/2022   11:49 AM 08/25/2022    5:51 PM 04/26/2022    9:41 PM  CMP  Glucose 70 - 99 mg/dL 94  102  725   BUN 6 - 20 mg/dL 11  11  9    Creatinine 0.44 - 1.00 mg/dL 3.66  4.40  3.47   Sodium 135 - 145 mmol/L 138  139  136   Potassium 3.5 - 5.1 mmol/L 3.9  3.9  3.6   Chloride 98 - 111 mmol/L 112  112  107   CO2 22 - 32 mmol/L 18  18  20    Calcium 8.9 - 10.3 mg/dL 9.2  7.8  9.0   Total Protein 6.5 - 8.1 g/dL 7.5     Total Bilirubin 0.3 - 1.2 mg/dL 0.8     Alkaline Phos 38 - 126 U/L 78     AST 15 - 41 U/L 20     ALT 0 - 44 U/L 25

## 2023-02-15 DIAGNOSIS — J3081 Allergic rhinitis due to animal (cat) (dog) hair and dander: Secondary | ICD-10-CM | POA: Diagnosis not present

## 2023-02-15 DIAGNOSIS — J301 Allergic rhinitis due to pollen: Secondary | ICD-10-CM | POA: Diagnosis not present

## 2023-02-15 DIAGNOSIS — J3089 Other allergic rhinitis: Secondary | ICD-10-CM | POA: Diagnosis not present

## 2023-02-24 DIAGNOSIS — J3089 Other allergic rhinitis: Secondary | ICD-10-CM | POA: Diagnosis not present

## 2023-02-24 DIAGNOSIS — J301 Allergic rhinitis due to pollen: Secondary | ICD-10-CM | POA: Diagnosis not present

## 2023-02-24 DIAGNOSIS — J3081 Allergic rhinitis due to animal (cat) (dog) hair and dander: Secondary | ICD-10-CM | POA: Diagnosis not present

## 2023-03-01 DIAGNOSIS — J3089 Other allergic rhinitis: Secondary | ICD-10-CM | POA: Diagnosis not present

## 2023-03-01 DIAGNOSIS — J3081 Allergic rhinitis due to animal (cat) (dog) hair and dander: Secondary | ICD-10-CM | POA: Diagnosis not present

## 2023-03-01 DIAGNOSIS — J4520 Mild intermittent asthma, uncomplicated: Secondary | ICD-10-CM | POA: Diagnosis not present

## 2023-03-01 DIAGNOSIS — J301 Allergic rhinitis due to pollen: Secondary | ICD-10-CM | POA: Diagnosis not present

## 2023-03-02 DIAGNOSIS — J4540 Moderate persistent asthma, uncomplicated: Secondary | ICD-10-CM | POA: Diagnosis not present

## 2023-03-02 DIAGNOSIS — D72829 Elevated white blood cell count, unspecified: Secondary | ICD-10-CM | POA: Diagnosis not present

## 2023-03-02 DIAGNOSIS — J309 Allergic rhinitis, unspecified: Secondary | ICD-10-CM | POA: Diagnosis not present

## 2023-03-02 DIAGNOSIS — R0981 Nasal congestion: Secondary | ICD-10-CM | POA: Diagnosis not present

## 2023-03-02 DIAGNOSIS — D75839 Thrombocytosis, unspecified: Secondary | ICD-10-CM | POA: Diagnosis not present

## 2023-03-02 DIAGNOSIS — J329 Chronic sinusitis, unspecified: Secondary | ICD-10-CM | POA: Diagnosis not present

## 2023-03-02 DIAGNOSIS — J3489 Other specified disorders of nose and nasal sinuses: Secondary | ICD-10-CM | POA: Diagnosis not present

## 2023-03-08 DIAGNOSIS — J301 Allergic rhinitis due to pollen: Secondary | ICD-10-CM | POA: Diagnosis not present

## 2023-03-08 DIAGNOSIS — J3089 Other allergic rhinitis: Secondary | ICD-10-CM | POA: Diagnosis not present

## 2023-03-08 DIAGNOSIS — J3081 Allergic rhinitis due to animal (cat) (dog) hair and dander: Secondary | ICD-10-CM | POA: Diagnosis not present

## 2023-03-17 DIAGNOSIS — J3089 Other allergic rhinitis: Secondary | ICD-10-CM | POA: Diagnosis not present

## 2023-03-17 DIAGNOSIS — J3081 Allergic rhinitis due to animal (cat) (dog) hair and dander: Secondary | ICD-10-CM | POA: Diagnosis not present

## 2023-03-17 DIAGNOSIS — J301 Allergic rhinitis due to pollen: Secondary | ICD-10-CM | POA: Diagnosis not present

## 2023-03-21 DIAGNOSIS — R5383 Other fatigue: Secondary | ICD-10-CM | POA: Diagnosis not present

## 2023-03-21 DIAGNOSIS — R569 Unspecified convulsions: Secondary | ICD-10-CM | POA: Diagnosis not present

## 2023-03-21 DIAGNOSIS — R55 Syncope and collapse: Secondary | ICD-10-CM | POA: Diagnosis not present

## 2023-03-21 DIAGNOSIS — R42 Dizziness and giddiness: Secondary | ICD-10-CM | POA: Diagnosis not present

## 2023-03-21 DIAGNOSIS — G44009 Cluster headache syndrome, unspecified, not intractable: Secondary | ICD-10-CM | POA: Diagnosis not present

## 2023-03-21 DIAGNOSIS — R110 Nausea: Secondary | ICD-10-CM | POA: Diagnosis not present

## 2023-03-24 DIAGNOSIS — J3089 Other allergic rhinitis: Secondary | ICD-10-CM | POA: Diagnosis not present

## 2023-03-24 DIAGNOSIS — J301 Allergic rhinitis due to pollen: Secondary | ICD-10-CM | POA: Diagnosis not present

## 2023-03-24 DIAGNOSIS — J3081 Allergic rhinitis due to animal (cat) (dog) hair and dander: Secondary | ICD-10-CM | POA: Diagnosis not present

## 2023-03-29 ENCOUNTER — Encounter (INDEPENDENT_AMBULATORY_CARE_PROVIDER_SITE_OTHER): Payer: Self-pay

## 2023-03-29 ENCOUNTER — Ambulatory Visit (INDEPENDENT_AMBULATORY_CARE_PROVIDER_SITE_OTHER): Payer: Medicaid Other | Admitting: Otolaryngology

## 2023-03-29 VITALS — Ht 62.0 in | Wt 205.0 lb

## 2023-03-29 DIAGNOSIS — J324 Chronic pansinusitis: Secondary | ICD-10-CM | POA: Insufficient documentation

## 2023-03-29 DIAGNOSIS — J338 Other polyp of sinus: Secondary | ICD-10-CM | POA: Diagnosis not present

## 2023-03-29 DIAGNOSIS — J343 Hypertrophy of nasal turbinates: Secondary | ICD-10-CM

## 2023-03-31 DIAGNOSIS — J3089 Other allergic rhinitis: Secondary | ICD-10-CM | POA: Diagnosis not present

## 2023-03-31 DIAGNOSIS — J343 Hypertrophy of nasal turbinates: Secondary | ICD-10-CM | POA: Insufficient documentation

## 2023-03-31 DIAGNOSIS — J301 Allergic rhinitis due to pollen: Secondary | ICD-10-CM | POA: Diagnosis not present

## 2023-03-31 DIAGNOSIS — J3081 Allergic rhinitis due to animal (cat) (dog) hair and dander: Secondary | ICD-10-CM | POA: Diagnosis not present

## 2023-03-31 NOTE — Progress Notes (Signed)
Patient ID: April Rush, female   DOB: 20-Aug-2000, 22 y.o.   MRN: [id number]  CC: Recurrent sinusitis, chronic nasal congestion, chronic nasal drainage  HPI:  April Rush is an 22 y.o. female who presents today complaining of recurrent sinusitis, chronic nasal congestion, and chronic nasal drainage for the past 3+ years.  She was previously evaluated by an allergist, and was treated with immunotherapy.  She was also treated with multiple antibiotics and multiple allergy medications and nasal sprays.  She underwent adenotonsillectomy as a child.  She has no previous nasal surgery.  Due to her chronic nasal obstruction, she is a habitual mouth breather.  Her recent MRI scan in June 2024 showed pansinusitis.  Past Medical History:  Diagnosis Date   Asthma    Hypertension    Patient denies medical problems    Tachycardia     Past Surgical History:  Procedure Laterality Date   Denies surgical history      Family History  Problem Relation Age of Onset   Cirrhosis Maternal Grandfather        not related to alcohol   Hypertension Father     Social History:  reports that she has never smoked. She has never used smokeless tobacco. She reports that she does not currently use alcohol. She reports that she does not use drugs.  Allergies:  Allergies  Allergen Reactions   Pollen Extract-Tree Extract [Pollen Extract] Shortness Of Breath   Dog Epithelium Itching   Melatonin Hives and Rash    Prior to Admission medications   Medication Sig Start Date End Date Taking? Authorizing Provider  budesonide-formoterol (SYMBICORT) 160-4.5 MCG/ACT inhaler Inhale 2 puffs into the lungs 2 (two) times daily.   Yes [provider]  azelastine (ASTELIN) 0.1 % nasal spray Place 1 spray into both nostrils 2 (two) times daily. Use in each nostril as directed Patient not taking: Reported on 03/29/2023 08/04/22   Immordino, Jeannett Senior, FNP  escitalopram (LEXAPRO) 10 MG tablet Take 10 mg by mouth  daily. Patient not taking: Reported on 02/09/2023 12/07/22   [provider]  nortriptyline (PAMELOR) 10 MG capsule Start Nortriptyline (Pamelor) 10 mg nightly for one week, then increase to 20 mg nightly Patient not taking: Reported on 03/29/2023 11/03/22   [provider]    Height 5\' 2"  (1.575 m), weight 205 lb (93 kg). Exam: General: Communicates without difficulty, well nourished, no acute distress. Head: Normocephalic, no evidence injury, no tenderness, facial buttresses intact without stepoff. Face/sinus: No tenderness to palpation and percussion. Facial movement is normal and symmetric. Eyes: PERRL, EOMI. No scleral icterus, conjunctivae clear. Neuro: CN II exam reveals vision grossly intact.  No nystagmus at any point of gaze. Ears: Auricles well formed without lesions.  Ear canals are intact without mass or lesion.  No erythema or edema is appreciated.  The TMs are intact without fluid. Nose: External evaluation reveals normal support and skin without lesions.  Dorsum is intact.  Anterior rhinoscopy reveals congested mucosa over anterior aspect of inferior turbinates and intact septum.  Oral:  Oral cavity and oropharynx are intact, symmetric, without erythema or edema.  Mucosa is moist without lesions. Neck: Full range of motion without pain.  There is no significant lymphadenopathy.  No masses palpable.  Thyroid bed within normal limits to palpation.  Parotid glands and submandibular glands equal bilaterally without mass.  Trachea is midline. Neuro:  CN 2-12 grossly intact.    Procedure:  Flexible Nasal Endoscopy: Description: Risks, benefits, and alternatives  of flexible endoscopy were explained to the patient.  Specific mention was made of the risk of throat numbness with difficulty swallowing, possible bleeding from the nose and mouth, and pain from the procedure.  The patient gave oral consent to proceed.  The flexible scope was inserted into the right nasal cavity.  Endoscopy  of the interior nasal cavity, superior, inferior, and middle meatus was performed. The sphenoid-ethmoid recess was examined. Edematous mucosa was noted.  Polypoid tissue was noted to obstruct the nasal cavity. Olfactory cleft was clear.  Nasopharynx was clear.  Turbinates were hypertrophied but without mass.  The procedure was repeated on the contralateral side with similar findings.  The patient tolerated the procedure well.    Assessment: 1.  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  Her MRI scan in June showed bilateral pansinusitis. 2.  Bilateral inferior turbinate hypertrophy.  Plan: 1.  The physical exam and nasal endoscopy findings are reviewed with the patient. 2.  Continue with her current allergy treatment regimen. 3.  High-dose prednisone Dosepak for 12 days. 4.  The patient will return for reevaluation after her CT scan.  Marygrace Sandoval W Amiera Herzberg 03/31/2023, 9:37 AM

## 2023-04-07 DIAGNOSIS — J3081 Allergic rhinitis due to animal (cat) (dog) hair and dander: Secondary | ICD-10-CM | POA: Diagnosis not present

## 2023-04-07 DIAGNOSIS — J301 Allergic rhinitis due to pollen: Secondary | ICD-10-CM | POA: Diagnosis not present

## 2023-04-07 DIAGNOSIS — J3089 Other allergic rhinitis: Secondary | ICD-10-CM | POA: Diagnosis not present

## 2023-04-14 DIAGNOSIS — J301 Allergic rhinitis due to pollen: Secondary | ICD-10-CM | POA: Diagnosis not present

## 2023-04-14 DIAGNOSIS — J3089 Other allergic rhinitis: Secondary | ICD-10-CM | POA: Diagnosis not present

## 2023-04-14 DIAGNOSIS — J3081 Allergic rhinitis due to animal (cat) (dog) hair and dander: Secondary | ICD-10-CM | POA: Diagnosis not present

## 2023-04-22 DIAGNOSIS — J301 Allergic rhinitis due to pollen: Secondary | ICD-10-CM | POA: Diagnosis not present

## 2023-04-22 DIAGNOSIS — J3081 Allergic rhinitis due to animal (cat) (dog) hair and dander: Secondary | ICD-10-CM | POA: Diagnosis not present

## 2023-04-22 DIAGNOSIS — J3089 Other allergic rhinitis: Secondary | ICD-10-CM | POA: Diagnosis not present

## 2023-04-26 ENCOUNTER — Ambulatory Visit (INDEPENDENT_AMBULATORY_CARE_PROVIDER_SITE_OTHER): Payer: Medicaid Other | Admitting: Otolaryngology

## 2023-04-26 ENCOUNTER — Encounter (INDEPENDENT_AMBULATORY_CARE_PROVIDER_SITE_OTHER): Payer: Self-pay

## 2023-04-26 VITALS — Ht 62.0 in | Wt 205.0 lb

## 2023-04-26 DIAGNOSIS — J329 Chronic sinusitis, unspecified: Secondary | ICD-10-CM

## 2023-04-26 DIAGNOSIS — R0981 Nasal congestion: Secondary | ICD-10-CM | POA: Diagnosis not present

## 2023-04-26 DIAGNOSIS — J324 Chronic pansinusitis: Secondary | ICD-10-CM

## 2023-04-26 DIAGNOSIS — J338 Other polyp of sinus: Secondary | ICD-10-CM | POA: Diagnosis not present

## 2023-04-26 DIAGNOSIS — J343 Hypertrophy of nasal turbinates: Secondary | ICD-10-CM

## 2023-04-27 NOTE — Progress Notes (Signed)
Patient ID: April Rush, female   DOB: 09-21-2000, 22 y.o.   MRN: [id number]  Follow-up: Chronic rhinosinusitis, sinonasal polyps, bilateral inferior turbinate hypertrophy  HPI: The patient is a 22 year old female who returns today for her follow-up evaluation.  She was last seen 1 month ago.  At that time, she was complaining of recurrent sinusitis, chronic nasal congestion, and chronic nasal drainage.  She was previously treated with multiple courses of antibiotics, allergy medications, immunotherapy, and steroid nasal sprays.  On examination, he was noted to have bilateral sinonasal polyps and bilateral pansinusitis.  Both inferior turbinates were hypertrophied.  She was placed on high-dose prednisone for 12 days.  According to the patient, her nasal breathing improved briefly while she was on the high-dose prednisone.  However, the nasal congestion has returned.  She reports frequent facial pressure, pain, and nasal drainage.  Exam: General: Communicates without difficulty, well nourished, no acute distress. Head: Normocephalic, no evidence injury, no tenderness, facial buttresses intact without stepoff. Face/sinus: No tenderness to palpation and percussion. Facial movement is normal and symmetric. Eyes: PERRL, EOMI. No scleral icterus, conjunctivae clear. Neuro: CN II exam reveals vision grossly intact.  No nystagmus at any point of gaze. Ears: Auricles well formed without lesions.  Ear canals are intact without mass or lesion.  No erythema or edema is appreciated.  The TMs are intact without fluid. Nose: External evaluation reveals normal support and skin without lesions.  Dorsum is intact.  Anterior rhinoscopy reveals congested mucosa over anterior aspect of inferior turbinates and intact septum.  Bilateral nasal polyps and inferior turbinate hypertrophy.  Oral:  Oral cavity and oropharynx are intact, symmetric, without erythema or edema.  Mucosa is moist without lesions. Neck: Full range of motion  without pain.  There is no significant lymphadenopathy.  No masses palpable.  Thyroid bed within normal limits to palpation.  Parotid glands and submandibular glands equal bilaterally without mass.  Trachea is midline. Neuro:  CN 2-12 grossly intact.    Assessment: 1.  Bilateral chronic rhinosinusitis, with diffuse nasal mucosal congestion and bilateral sinonasal polyps.  Her MRI scan in June showed bilateral pansinusitis. 2.  Bilateral inferior turbinate hypertrophy. 3.  The patient has not responded to extensive medical treatment, including multiple antibiotics, systemic and topical steroids, immunotherapy, and allergy medications.  Plan: 1.  The physical exam findings are reviewed with the patient. 2.  Continue with her current allergy treatment regimen.  The importance of consistent daily use of her steroid nasal spray is discussed. 3.  Sinus CT scan to evaluate the status of her chronic sinusitis and sinonasal polyps. 4.  The patient will return for reevaluation after her sinus CT scan.  Based on the above findings, she we will likely benefit from surgical intervention with bilateral endoscopic sinus surgery and turbinate reduction.

## 2023-05-03 DIAGNOSIS — J3089 Other allergic rhinitis: Secondary | ICD-10-CM | POA: Diagnosis not present

## 2023-05-03 DIAGNOSIS — J301 Allergic rhinitis due to pollen: Secondary | ICD-10-CM | POA: Diagnosis not present

## 2023-05-03 DIAGNOSIS — J3081 Allergic rhinitis due to animal (cat) (dog) hair and dander: Secondary | ICD-10-CM | POA: Diagnosis not present

## 2023-05-10 ENCOUNTER — Ambulatory Visit
Admission: RE | Admit: 2023-05-10 | Discharge: 2023-05-10 | Disposition: A | Discharge status: Home / Self Care | Payer: Medicaid Other | Source: Ambulatory Visit | Attending: Otolaryngology | Admitting: Otolaryngology

## 2023-05-10 DIAGNOSIS — Z01818 Encounter for other preprocedural examination: Secondary | ICD-10-CM | POA: Diagnosis not present

## 2023-05-10 DIAGNOSIS — J329 Chronic sinusitis, unspecified: Secondary | ICD-10-CM | POA: Diagnosis not present

## 2023-05-10 DIAGNOSIS — J338 Other polyp of sinus: Secondary | ICD-10-CM | POA: Diagnosis not present

## 2023-05-10 DIAGNOSIS — J3489 Other specified disorders of nose and nasal sinuses: Secondary | ICD-10-CM | POA: Diagnosis not present

## 2023-05-10 DIAGNOSIS — J324 Chronic pansinusitis: Secondary | ICD-10-CM | POA: Diagnosis not present

## 2023-05-12 DIAGNOSIS — J3089 Other allergic rhinitis: Secondary | ICD-10-CM | POA: Diagnosis not present

## 2023-05-12 DIAGNOSIS — J3081 Allergic rhinitis due to animal (cat) (dog) hair and dander: Secondary | ICD-10-CM | POA: Diagnosis not present

## 2023-05-12 DIAGNOSIS — J301 Allergic rhinitis due to pollen: Secondary | ICD-10-CM | POA: Diagnosis not present

## 2023-06-27 ENCOUNTER — Telehealth (INDEPENDENT_AMBULATORY_CARE_PROVIDER_SITE_OTHER): Payer: Self-pay | Admitting: Otolaryngology

## 2023-06-27 NOTE — Telephone Encounter (Signed)
confirmed appt & location [phone] [id number]

## 2023-06-28 ENCOUNTER — Ambulatory Visit (INDEPENDENT_AMBULATORY_CARE_PROVIDER_SITE_OTHER): Payer: Medicaid Other | Admitting: Otolaryngology

## 2023-06-28 ENCOUNTER — Encounter (INDEPENDENT_AMBULATORY_CARE_PROVIDER_SITE_OTHER): Payer: Self-pay

## 2023-06-28 VITALS — BP 127/85 | HR 96 | Ht 62.0 in | Wt 205.0 lb

## 2023-06-28 DIAGNOSIS — J338 Other polyp of sinus: Secondary | ICD-10-CM

## 2023-06-28 DIAGNOSIS — J343 Hypertrophy of nasal turbinates: Secondary | ICD-10-CM | POA: Diagnosis not present

## 2023-06-28 DIAGNOSIS — J324 Chronic pansinusitis: Secondary | ICD-10-CM

## 2023-06-30 NOTE — Progress Notes (Signed)
Patient ID: April Rush, female   DOB: 04/13/2001, 22 y.o.   MRN: [id number]  Follow-up: Chronic rhinosinusitis, sinonasal polyps, bilateral inferior turbinate hypertrophy   HPI: The patient is a 23 year old female who returns today for her follow-up evaluation.  The patient was previously seen for bilateral chronic rhinosinusitis, sinonasal polyps, and chronic nasal obstruction.  The patient was noted to have polypoid tissue obstructing her nasal cavities bilaterally.  She also has bilateral severe inferior turbinate hypertrophy.  She was previously treated with multiple courses of antibiotics, allergy medications, immunotherapy, systemic steroids, and steroid nasal sprays.  She recently underwent a sinus CT scan.  The CT showed bilateral pansinusitis, with opacification of all her paranasal sinuses and sinus outflow tracts.  The patient recently completed a 12-day course of high-dose prednisone.  She returns today complaining of persistent nasal congestion and facial pressure.  She has not responded to medical treatment, and is interested in surgical intervention.  Exam: General: Communicates without difficulty, well nourished, no acute distress. Head: Normocephalic, no evidence injury, no tenderness, facial buttresses intact without stepoff. Face/sinus: No tenderness to palpation and percussion. Facial movement is normal and symmetric. Eyes: PERRL, EOMI. No scleral icterus, conjunctivae clear. Neuro: CN II exam reveals vision grossly intact.  No nystagmus at any point of gaze. Ears: Auricles well formed without lesions.  Ear canals are intact without mass or lesion.  No erythema or edema is appreciated.  The TMs are intact without fluid. Nose: External evaluation reveals normal support and skin without lesions.  Dorsum is intact.  Anterior rhinoscopy reveals congested mucosa over anterior aspect of inferior turbinates and intact septum.  Bilateral nasal polyps and inferior turbinate hypertrophy.  Oral:   Oral cavity and oropharynx are intact, symmetric, without erythema or edema.  Mucosa is moist without lesions. Neck: Full range of motion without pain.  There is no significant lymphadenopathy.  No masses palpable.  Thyroid bed within normal limits to palpation.  Parotid glands and submandibular glands equal bilaterally without mass.  Trachea is midline. Neuro:  CN 2-12 grossly intact.    Assessment: 1.  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  The patient has not responded to maximal medical treatment for the past year. 2.  Bilateral severe inferior turbinate hypertrophy.  Plan: 1.  The physical exam findings and the CT images are extensively reviewed with the patient. 2.  Continue with her current allergy treatment regimen. 3.  Based on the above findings, the patient will benefit from surgical intervention with bilateral endoscopic sinus surgery and bilateral partial inferior turbinate resection.  The risk, benefits, alternatives, and details of the procedures are explained [place] reviewed.  Questions are invited and answered. 4.  The patient would like to proceed with the procedures.

## 2023-07-25 ENCOUNTER — Other Ambulatory Visit: Payer: Self-pay

## 2023-07-25 ENCOUNTER — Encounter (HOSPITAL_BASED_OUTPATIENT_CLINIC_OR_DEPARTMENT_OTHER): Payer: Self-pay | Admitting: Otolaryngology

## 2023-07-25 NOTE — Progress Notes (Signed)
   07/25/23 1044  PAT Phone Screen  Is the patient taking a GLP-1 receptor agonist? No  Do You Have Diabetes? No  Do You Have Hypertension? Yes  Have You Ever Been to the ER for Asthma? No  Have You Taken Oral Steroids in the Past 3 Months? No  Do you Take Phenteramine or any Other Diet Drugs? No  Recent  Lab Work, EKG, CXR? (S)  Yes  Where was this test performed? (S)  EKG SR 10/18/22  Do you have a history of heart problems? No  Have you ever had tests on your heart? (S)  Yes  What cardiac tests were performed? (S)  Echo  What date/year were cardiac tests completed? (S)  TEE done while pt was inpt for Bronchitis 02/05/2022 EF 70% normal study  Any Recent Hospitalizations? No  Height 5\' 2"  (1.575 m)  Weight 93 kg  Pat Appointment Scheduled No  Reason for No Appointment Not Needed   Pt also evaluated by Dr. Malvin Johns ([facility]) for trembling hands/ r/o seizure like activity. OV 03/29/23. Previous EEG 11/15/22 wnl, Normal Brain MRI 11/05/22. No f/u neuro appointments indicated.

## 2023-07-31 NOTE — Anesthesia Preprocedure Evaluation (Signed)
 Anesthesia Evaluation  Patient identified by MRN, date of birth, ID band Patient awake    Reviewed: Allergy & Precautions, H&P , NPO status , Patient's Chart, lab work & pertinent test results  Airway Mallampati: III  TM Distance: >3 FB Neck ROM: Full    Dental no notable dental hx. (+) Teeth Intact, Dental Advisory Given   Pulmonary asthma    Pulmonary exam normal breath sounds clear to auscultation       Cardiovascular Exercise Tolerance: Good hypertension,  Rhythm:Regular Rate:Normal     Neuro/Psych   Anxiety     negative neurological ROS     GI/Hepatic Neg liver ROS,GERD  ,,  Endo/Other    Class 3 obesity  Renal/GU negative Renal ROS  negative genitourinary   Musculoskeletal   Abdominal   Peds  Hematology negative hematology ROS (+)   Anesthesia Other Findings   Reproductive/Obstetrics negative OB ROS                             Anesthesia Physical Anesthesia Plan  ASA: 3  Anesthesia Plan: General   Post-op Pain Management: Tylenol PO (pre-op)* and Precedex   Induction: Intravenous  PONV Risk Score and Plan: 4 or greater and Ondansetron, Dexamethasone and Midazolam  Airway Management Planned: Oral ETT  Additional Equipment:   Intra-op Plan:   Post-operative Plan: Extubation in OR  Informed Consent: I have reviewed the patients History and Physical, chart, labs and discussed the procedure including the risks, benefits and alternatives for the proposed anesthesia with the patient or authorized representative who has indicated his/her understanding and acceptance.     Dental advisory given  Plan Discussed with: CRNA  Anesthesia Plan Comments:        Anesthesia Quick Evaluation

## 2023-08-01 ENCOUNTER — Other Ambulatory Visit: Payer: Self-pay

## 2023-08-01 ENCOUNTER — Encounter (HOSPITAL_BASED_OUTPATIENT_CLINIC_OR_DEPARTMENT_OTHER)
Admission: RE | Disposition: A | Discharge status: Home / Self Care | Payer: Self-pay | Source: Ambulatory Visit | Attending: Otolaryngology

## 2023-08-01 ENCOUNTER — Ambulatory Visit (HOSPITAL_BASED_OUTPATIENT_CLINIC_OR_DEPARTMENT_OTHER): Payer: Self-pay | Admitting: Anesthesiology

## 2023-08-01 ENCOUNTER — Encounter (HOSPITAL_BASED_OUTPATIENT_CLINIC_OR_DEPARTMENT_OTHER): Payer: Self-pay | Admitting: Otolaryngology

## 2023-08-01 ENCOUNTER — Ambulatory Visit (HOSPITAL_BASED_OUTPATIENT_CLINIC_OR_DEPARTMENT_OTHER)
Admission: RE | Admit: 2023-08-01 | Discharge: 2023-08-01 | Disposition: A | Discharge status: Home / Self Care | Payer: Medicaid Other | Source: Ambulatory Visit | Attending: Otolaryngology | Admitting: Otolaryngology

## 2023-08-01 DIAGNOSIS — I10 Essential (primary) hypertension: Secondary | ICD-10-CM

## 2023-08-01 DIAGNOSIS — J45909 Unspecified asthma, uncomplicated: Secondary | ICD-10-CM | POA: Diagnosis not present

## 2023-08-01 DIAGNOSIS — K219 Gastro-esophageal reflux disease without esophagitis: Secondary | ICD-10-CM | POA: Diagnosis not present

## 2023-08-01 DIAGNOSIS — Z6837 Body mass index (BMI) 37.0-37.9, adult: Secondary | ICD-10-CM | POA: Diagnosis not present

## 2023-08-01 DIAGNOSIS — J309 Allergic rhinitis, unspecified: Secondary | ICD-10-CM | POA: Diagnosis not present

## 2023-08-01 DIAGNOSIS — J343 Hypertrophy of nasal turbinates: Secondary | ICD-10-CM | POA: Diagnosis not present

## 2023-08-01 DIAGNOSIS — J320 Chronic maxillary sinusitis: Secondary | ICD-10-CM

## 2023-08-01 DIAGNOSIS — J329 Chronic sinusitis, unspecified: Secondary | ICD-10-CM

## 2023-08-01 DIAGNOSIS — J321 Chronic frontal sinusitis: Secondary | ICD-10-CM

## 2023-08-01 DIAGNOSIS — J338 Other polyp of sinus: Secondary | ICD-10-CM | POA: Insufficient documentation

## 2023-08-01 DIAGNOSIS — D721 Eosinophilia, unspecified: Secondary | ICD-10-CM | POA: Diagnosis not present

## 2023-08-01 DIAGNOSIS — F419 Anxiety disorder, unspecified: Secondary | ICD-10-CM | POA: Insufficient documentation

## 2023-08-01 DIAGNOSIS — J324 Chronic pansinusitis: Secondary | ICD-10-CM | POA: Diagnosis not present

## 2023-08-01 DIAGNOSIS — E66813 Obesity, class 3: Secondary | ICD-10-CM | POA: Diagnosis not present

## 2023-08-01 DIAGNOSIS — J323 Chronic sphenoidal sinusitis: Secondary | ICD-10-CM

## 2023-08-01 DIAGNOSIS — J322 Chronic ethmoidal sinusitis: Secondary | ICD-10-CM

## 2023-08-01 DIAGNOSIS — J3489 Other specified disorders of nose and nasal sinuses: Secondary | ICD-10-CM | POA: Insufficient documentation

## 2023-08-01 DIAGNOSIS — Z01818 Encounter for other preprocedural examination: Secondary | ICD-10-CM

## 2023-08-01 HISTORY — PX: FRONTAL SINUS EXPLORATION: SHX6591

## 2023-08-01 HISTORY — PX: MAXILLARY ANTROSTOMY: SHX2003

## 2023-08-01 HISTORY — PX: ETHMOIDECTOMY: SHX5197

## 2023-08-01 HISTORY — DX: Gastro-esophageal reflux disease without esophagitis: K21.9

## 2023-08-01 HISTORY — DX: Other hemoglobinopathies: D58.2

## 2023-08-01 HISTORY — PX: SINUS ENDO WITH FUSION: SHX5329

## 2023-08-01 HISTORY — PX: SPHENOIDECTOMY: SHX2421

## 2023-08-01 HISTORY — DX: Anxiety disorder, unspecified: F41.9

## 2023-08-01 HISTORY — PX: TURBINATE REDUCTION: SHX6157

## 2023-08-01 SURGERY — REDUCTION, NASAL TURBINATE
Anesthesia: General | Site: Nose | Laterality: Bilateral

## 2023-08-01 MED ORDER — ONDANSETRON HCL 4 MG/2ML IJ SOLN
INTRAMUSCULAR | Status: DC | PRN
  Administered 2023-08-01: 4 mg via INTRAVENOUS

## 2023-08-01 MED ORDER — 0.9 % SODIUM CHLORIDE (POUR BTL) OPTIME
TOPICAL | Status: DC | PRN
  Administered 2023-08-01: 120 mL

## 2023-08-01 MED ORDER — MUPIROCIN 2 % EX OINT
TOPICAL_OINTMENT | CUTANEOUS | Status: DC | PRN
  Administered 2023-08-01: 1 via TOPICAL

## 2023-08-01 MED ORDER — HYDROMORPHONE HCL 1 MG/ML IJ SOLN
INTRAMUSCULAR | Status: AC
  Filled 2023-08-01: qty 0.5

## 2023-08-01 MED ORDER — ONDANSETRON HCL 4 MG/2ML IJ SOLN
INTRAMUSCULAR | Status: AC
  Filled 2023-08-01: qty 2

## 2023-08-01 MED ORDER — SODIUM CHLORIDE (PF) 0.9 % IJ SOLN
INTRAMUSCULAR | Status: AC
  Filled 2023-08-01: qty 10

## 2023-08-01 MED ORDER — DEXAMETHASONE SODIUM PHOSPHATE 10 MG/ML IJ SOLN
INTRAMUSCULAR | Status: DC | PRN
  Administered 2023-08-01: 10 mg via INTRAVENOUS

## 2023-08-01 MED ORDER — ROCURONIUM BROMIDE 10 MG/ML (PF) SYRINGE
PREFILLED_SYRINGE | INTRAVENOUS | Status: AC
  Filled 2023-08-01: qty 10

## 2023-08-01 MED ORDER — SODIUM CHLORIDE 0.9 % IV SOLN
INTRAVENOUS | Status: AC | PRN
Start: 2023-08-01 — End: 2023-08-01
  Administered 2023-08-01: 200 mL via INTRAMUSCULAR

## 2023-08-01 MED ORDER — PHENYLEPHRINE 80 MCG/ML (10ML) SYRINGE FOR IV PUSH (FOR BLOOD PRESSURE SUPPORT)
PREFILLED_SYRINGE | INTRAVENOUS | Status: AC
  Filled 2023-08-01: qty 10

## 2023-08-01 MED ORDER — LIDOCAINE 2% (20 MG/ML) 5 ML SYRINGE
INTRAMUSCULAR | Status: DC | PRN
  Administered 2023-08-01: 60 mg via INTRAVENOUS

## 2023-08-01 MED ORDER — LACTATED RINGERS IV SOLN: INTRAVENOUS | Status: DC

## 2023-08-01 MED ORDER — SUGAMMADEX SODIUM 200 MG/2ML IV SOLN
2.0000 mg/kg | Freq: Once | INTRAVENOUS | Status: DC
  Filled 2023-08-01: qty 2

## 2023-08-01 MED ORDER — CEFAZOLIN SODIUM 1 G IJ SOLR
INTRAMUSCULAR | Status: AC
  Filled 2023-08-01: qty 20

## 2023-08-01 MED ORDER — LIDOCAINE 2% (20 MG/ML) 5 ML SYRINGE
INTRAMUSCULAR | Status: AC
  Filled 2023-08-01: qty 5

## 2023-08-01 MED ORDER — FENTANYL CITRATE (PF) 100 MCG/2ML IJ SOLN
INTRAMUSCULAR | Status: AC
  Filled 2023-08-01: qty 2

## 2023-08-01 MED ORDER — OXYMETAZOLINE HCL 0.05 % NA SOLN
NASAL | Status: AC
  Filled 2023-08-01: qty 30

## 2023-08-01 MED ORDER — PROPOFOL 10 MG/ML IV BOLUS
INTRAVENOUS | Status: AC
  Filled 2023-08-01: qty 20

## 2023-08-01 MED ORDER — NEOSTIGMINE METHYLSULFATE 10 MG/10ML IV SOLN
INTRAVENOUS | Status: DC | PRN
  Administered 2023-08-01: 3 mg via INTRAVENOUS

## 2023-08-01 MED ORDER — ACETAMINOPHEN 500 MG PO TABS
ORAL_TABLET | ORAL | Status: AC
  Filled 2023-08-01: qty 2

## 2023-08-01 MED ORDER — MIDAZOLAM HCL 2 MG/2ML IJ SOLN
INTRAMUSCULAR | Status: AC
  Filled 2023-08-01: qty 2

## 2023-08-01 MED ORDER — GLYCOPYRROLATE PF 0.2 MG/ML IJ SOSY
PREFILLED_SYRINGE | INTRAMUSCULAR | Status: AC
  Filled 2023-08-01: qty 1

## 2023-08-01 MED ORDER — OXYMETAZOLINE HCL 0.05 % NA SOLN
NASAL | Status: DC | PRN
  Administered 2023-08-01: 2 via TOPICAL

## 2023-08-01 MED ORDER — ACETAMINOPHEN 500 MG PO TABS
1000.0000 mg | ORAL_TABLET | Freq: Once | ORAL | Status: AC
  Administered 2023-08-01: 1000 mg via ORAL

## 2023-08-01 MED ORDER — ROCURONIUM BROMIDE 10 MG/ML (PF) SYRINGE
PREFILLED_SYRINGE | INTRAVENOUS | Status: DC | PRN
  Administered 2023-08-01: 60 mg via INTRAVENOUS

## 2023-08-01 MED ORDER — MIDAZOLAM HCL 5 MG/5ML IJ SOLN
INTRAMUSCULAR | Status: DC | PRN
  Administered 2023-08-01: 2 mg via INTRAVENOUS

## 2023-08-01 MED ORDER — PHENYLEPHRINE 80 MCG/ML (10ML) SYRINGE FOR IV PUSH (FOR BLOOD PRESSURE SUPPORT)
PREFILLED_SYRINGE | INTRAVENOUS | Status: DC | PRN
  Administered 2023-08-01: 80 ug via INTRAVENOUS

## 2023-08-01 MED ORDER — NEOSTIGMINE METHYLSULFATE 3 MG/3ML IV SOSY
PREFILLED_SYRINGE | INTRAVENOUS | Status: AC
  Filled 2023-08-01: qty 3

## 2023-08-01 MED ORDER — GLYCOPYRROLATE 0.2 MG/ML IJ SOLN
INTRAMUSCULAR | Status: DC | PRN
  Administered 2023-08-01: .4 mg via INTRAVENOUS
  Administered 2023-08-01: .2 mg via INTRAVENOUS

## 2023-08-01 MED ORDER — FENTANYL CITRATE (PF) 100 MCG/2ML IJ SOLN
INTRAMUSCULAR | Status: DC | PRN
  Administered 2023-08-01: 100 ug via INTRAVENOUS
  Administered 2023-08-01: 50 ug via INTRAVENOUS
  Administered 2023-08-01: 25 ug via INTRAVENOUS

## 2023-08-01 MED ORDER — DEXMEDETOMIDINE HCL IN NACL 80 MCG/20ML IV SOLN
INTRAVENOUS | Status: DC | PRN
  Administered 2023-08-01: 12 ug via INTRAVENOUS

## 2023-08-01 MED ORDER — PROPOFOL 10 MG/ML IV BOLUS
INTRAVENOUS | Status: DC | PRN
  Administered 2023-08-01: 140 mg via INTRAVENOUS

## 2023-08-01 MED ORDER — CEFAZOLIN SODIUM-DEXTROSE 2-3 GM-%(50ML) IV SOLR
INTRAVENOUS | Status: DC | PRN
  Administered 2023-08-01: 2 g via INTRAVENOUS

## 2023-08-01 MED ORDER — HYDROMORPHONE HCL 1 MG/ML IJ SOLN
0.2500 mg | INTRAMUSCULAR | Status: DC | PRN
  Administered 2023-08-01 (×3): 0.5 mg via INTRAVENOUS

## 2023-08-01 MED ORDER — DEXAMETHASONE SODIUM PHOSPHATE 10 MG/ML IJ SOLN
INTRAMUSCULAR | Status: AC
  Filled 2023-08-01: qty 1

## 2023-08-01 SURGICAL SUPPLY — 48 items
BLADE RAD40 ROTATE 4M 4 5PK (BLADE) IMPLANT
BLADE RAD60 ROTATE M4 4 5PK (BLADE) IMPLANT
BLADE ROTATE RAD 12 4 M4 (BLADE) IMPLANT
BLADE ROTATE RAD 40 4 M4 (BLADE) IMPLANT
BLADE ROTATE TRICUT 4X13 M4 (BLADE) ×1 IMPLANT
BLADE TRICUT ROTATE M4 4 5PK (BLADE) IMPLANT
BUR HS RAD FRONTAL 3 (BURR) IMPLANT
CANISTER SUC SOCK COL 7IN (MISCELLANEOUS) ×2 IMPLANT
CANISTER SUCT 1200ML W/VALVE (MISCELLANEOUS) ×2 IMPLANT
COAGULATOR SUCT 8FR VV (MISCELLANEOUS) IMPLANT
DEFOGGER MIRROR 1QT (MISCELLANEOUS) ×1 IMPLANT
DRSG NASOPORE 8CM (GAUZE/BANDAGES/DRESSINGS) IMPLANT
ELECT REM PT RETURN 9FT ADLT (ELECTROSURGICAL) ×1 IMPLANT
ELECTRODE REM PT RTRN 9FT ADLT (ELECTROSURGICAL) ×1 IMPLANT
GAUZE SPONGE 2X2 STRL 8-PLY (GAUZE/BANDAGES/DRESSINGS) ×1 IMPLANT
GLOVE BIO SURGEON STRL SZ7.5 (GLOVE) ×1 IMPLANT
GLOVE BIOGEL PI IND STRL 7.0 (GLOVE) IMPLANT
GLOVE BIOGEL PI IND STRL 7.5 (GLOVE) IMPLANT
GLOVE ECLIPSE 6.5 STRL STRAW (GLOVE) IMPLANT
GLOVE SURG SYN 7.5 E (GLOVE) ×2 IMPLANT
GLOVE SURG SYN 7.5 PF PI (GLOVE) IMPLANT
GOWN STRL REUS W/ TWL LRG LVL3 (GOWN DISPOSABLE) ×2 IMPLANT
GOWN STRL REUS W/ TWL XL LVL3 (GOWN DISPOSABLE) IMPLANT
HEMOSTAT SURGICEL 2X14 (HEMOSTASIS) IMPLANT
IV NS 500ML BAXH (IV SOLUTION) ×1 IMPLANT
NDL HYPO 25X1 1.5 SAFETY (NEEDLE) IMPLANT
NDL SPNL 25GX3.5 QUINCKE BL (NEEDLE) IMPLANT
NEEDLE HYPO 25X1 1.5 SAFETY (NEEDLE) IMPLANT
NEEDLE SPNL 25GX3.5 QUINCKE BL (NEEDLE) IMPLANT
NS IRRIG 1000ML POUR BTL (IV SOLUTION) ×1 IMPLANT
PACK BASIN DAY SURGERY FS (CUSTOM PROCEDURE TRAY) ×1 IMPLANT
PACK ENT DAY SURGERY (CUSTOM PROCEDURE TRAY) ×1 IMPLANT
PAD MAGNETIC INSTR ST 16X20 (MISCELLANEOUS) IMPLANT
PATTIES SURGICAL .5 X3 (DISPOSABLE) IMPLANT
SLEEVE SCD COMPRESS KNEE MED (STOCKING) ×1 IMPLANT
SPIKE FLUID TRANSFER (MISCELLANEOUS) IMPLANT
SPLINT NASAL AIRWAY SILICONE (MISCELLANEOUS) IMPLANT
SPONGE NEURO XRAY DETECT 1X3 (DISPOSABLE) ×1 IMPLANT
SUCTION TUBE FRAZIER 10FR DISP (SUCTIONS) IMPLANT
SUT PROLENE 3 0 PS 2 (SUTURE) IMPLANT
SYR 50ML LL SCALE MARK (SYRINGE) ×1 IMPLANT
TOWEL GREEN STERILE FF (TOWEL DISPOSABLE) ×1 IMPLANT
TRACKER ENT INSTRUMENT (MISCELLANEOUS) ×1 IMPLANT
TRACKER ENT PATIENT (MISCELLANEOUS) ×1 IMPLANT
TUBE CONNECTING 20X1/4 (TUBING) ×1 IMPLANT
TUBE SALEM SUMP 16F (TUBING) ×1 IMPLANT
TUBING STRAIGHTSHOT EPS 5PK (TUBING) ×1 IMPLANT
YANKAUER SUCT BULB TIP NO VENT (SUCTIONS) ×1 IMPLANT

## 2023-08-01 NOTE — Op Note (Signed)
 DATE OF PROCEDURE: 08/01/2023  OPERATIVE REPORT   SURGEON: Newman Pies, MD   PREOPERATIVE DIAGNOSES:  1. Bilateral chronic pansinusitis and polyposis 2. Bilateral inferior turbinate hypertrophy.  3. Chronic nasal obstruction.  POSTOPERATIVE DIAGNOSES:  1. Bilateral chronic pansinusitis and polyposis 2. Bilateral inferior turbinate hypertrophy.  3. Chronic nasal obstruction.  PROCEDURE PERFORMED:  1.  Bilateral endoscopic total ethmoidectomy and sphenoidotomy with polyp removal. 2.  Bilateral endoscopic frontal sinusotomy with polyp removal. 3.  Bilateral endoscopic maxillary antrostomy with polyp removal. 4.  Bilateral partial inferior turbinate resection.  5.  FUSION stereotactic image guidance  ANESTHESIA: General endotracheal tube anesthesia.   COMPLICATIONS: None.   ESTIMATED BLOOD LOSS: 300 mL.   INDICATION FOR PROCEDURE: April Rush is a 23 y.o. female with a history of bilateral chronic rhinosinusitis, sinonasal polyps, and chronic nasal obstruction. The patient was noted to have polypoid tissue obstructing her nasal cavities bilaterally. She also has bilateral severe inferior turbinate hypertrophy. She was previously treated with multiple courses of antibiotics, allergy medications, immunotherapy, systemic steroids, and steroid nasal sprays. She recently underwent a sinus CT scan. The CT showed bilateral pansinusitis, with opacification of all her paranasal sinuses and sinus outflow tracts. Based on the above findings, the decision was made for the patient to undergo the above-stated procedures. The risks, benefits, alternatives, and details of the procedures were discussed with the patient. Questions were invited and answered. Informed consent was obtained.   DESCRIPTION OF PROCEDURE: The patient was taken to the operating room and placed supine on the operating table. General endotracheal tube anesthesia was administered by the anesthesiologist. The patient was positioned, and  prepped and draped in the standard fashion for nasal surgery. Pledgets soaked with Afrin were placed in both nasal cavities for decongestion. The pledgets were subsequently removed. The FUSION stereotactic image guidance marker was placed. The image guidance system was functional throughout the case.  Examination of the nasal cavity revealed bilateral inferior turbinate hypertrophy. The inferior one half of both hypertrophied inferior turbinate was crossclamped with a Kelly clamp. The inferior one half of each inferior turbinate was then resected with a pair of cross cutting scissors. Hemostasis was achieved with a suction cautery device.   Using a 0 endoscope, the left nasal cavity was examined. Polypoid tissue was noted within the middle meatus. The polypoid tissue was removed using a combination of microdebrider and Blakesley forceps. The uncinate process was resected with a freer elevator. The maxillary antrum was entered and enlarged using a combination of backbiter and microdebrider. Polypoid tissue was removed from the left maxillary sinus.  Attention was then focused on the ethmoid sinuses. The bony partitions of the anterior and posterior ethmoid cavities were taken down. Polypoid tissue was noted and removed.  More polyps were noted to obstruct the left sphenoid opening. The polyps were removed. The sphenoid opening was entered and enlarged.  Polypoid tissue was removed from the sphenoid sinus.  Attention was then focused on the frontal sinus. The frontal recess was identified and enlarged by removing the surrounding bony partitions. Polypoid tissue was removed from the frontal recess. All 4 paranasal sinuses were copiously irrigated with saline solution.  The same procedure was repeated on the right side without exception. More polyps were noted on the right side. All polyps were removed. Doyle splints were applied to the nasal septum.  The care of the patient was turned over to the  anesthesiologist. The patient was awakened from anesthesia without difficulty. The patient was extubated and transferred  to the recovery room in good condition.   OPERATIVE FINDINGS: Bilateral chronic pansinusitis and polyposis.  Bilateral inferior turbinate hypertrophy.   SPECIMEN: Bilateral sinus contents.   FOLLOWUP CARE: The patient be discharged home once she is awake and alert.The patient will follow up in my office in 3 days for sinus debridement.  April Barrack Philomena Doheny, MD

## 2023-08-01 NOTE — Anesthesia Postprocedure Evaluation (Signed)
 Anesthesia Post Note  Patient: April Rush  Procedure(s) Performed: Frederik Schmidt REDUCTION (Bilateral: Nose) SINUS ENDO WITH STEALTH NAVIGATION (Bilateral: Nose) MAXILLARY ANTROSTOMY (Bilateral: Nose) FRONTAL SINUS EXPLORATION (Bilateral: Nose) ETHMOIDECTOMY (Bilateral: Nose) SPHENOIDECTOMY (Bilateral: Nose)     Patient location during evaluation: PACU Anesthesia Type: General Level of consciousness: awake and alert Pain management: pain level controlled Vital Signs Assessment: post-procedure vital signs reviewed and stable Respiratory status: spontaneous breathing, nonlabored ventilation and respiratory function stable Cardiovascular status: blood pressure returned to baseline and stable Postop Assessment: no apparent nausea or vomiting Anesthetic complications: no  No notable events documented.  Last Vitals:  Vitals:   08/01/23 1158 08/01/23 1222  BP:  92/77  Pulse: 83 77  Resp: 17 16  Temp:  37 C  SpO2: 93% 93%    Last Pain:  Vitals:   08/01/23 1222  TempSrc: Temporal  PainSc: 3                  Jenay Morici,W. EDMOND

## 2023-08-01 NOTE — Discharge Instructions (Addendum)
 POSTOPERATIVE INSTRUCTIONS FOR PATIENTS HAVING NASAL OR SINUS OPERATIONS ACTIVITY: Restrict activity at home for the first two days, resting as much as possible. Light activity is best. You may usually return to work within a week. You should refrain from nose blowing, strenuous activity, or heavy lifting greater than 20lbs for a total of one week after your operation.  If sneezing cannot be avoided, sneeze with your mouth open. DISCOMFORT: You may experience a dull headache and pressure along with nasal congestion and discharge. These symptoms may be worse during the first week after the operation but may last as long as two to four weeks.  Please take Tylenol or the pain medication that has been prescribed for you. Do not take aspirin or aspirin containing medications since they may cause bleeding.  You may experience symptoms of post nasal drainage, nasal congestion, headaches and fatigue for two or three months after your operation.  BLEEDING: You may have some blood tinged nasal drainage for approximately two weeks after the operation.  The discharge will be worse for the first week.  Please call our office at [phone] or go to the nearest hospital emergency room if you experience any of the following: heavy, bright red blood from your nose or mouth that lasts longer than 15 minutes or coughing up or vomiting bright red blood or blood clots. GENERAL CONSIDERATIONS: A gauze dressing will be placed on your upper lip to absorb any drainage after the operation. You may need to change this several times a day.  If you do not have very much drainage, you may remove the dressing.  Remember that you may gently wipe your nose with a tissue and sniff in, but DO NOT blow your nose. Please keep all of your postoperative appointments.  Your final results after the operation will depend on proper follow-up.  The initial visit is usually 2 to 5 days after the operation.  During this visit, the remaining nasal  packing and internal septal splints will be removed.  Your nasal and sinus cavities will be cleaned.  During the second visit, your nasal and sinus cavities will be cleaned again. Have someone drive you to your first two postoperative appointments.  How you care for your nose after the operation will influence the results that you obtain.  You should follow all directions, take your medication as prescribed, and call our office [phone] with any problems or questions. You may be more comfortable sleeping with your head elevated on two pillows. Do not take any medications that we have not prescribed or recommended. WARNING SIGNS: if any of the following should occur, please call our office: Persistent fever greater than 102F. Persistent vomiting. Severe and constant pain that is not relieved by prescribed pain medication. Trauma to the nose. Rash or unusual side effects from any medicines.   Post Anesthesia Home Care Instructions  Activity: Get plenty of rest for the remainder of the day. A responsible individual must stay with you for 24 hours following the procedure.  For the next 24 hours, DO NOT: -Drive a car -Advertising copywriter -Drink alcoholic beverages -Take any medication unless instructed by your physician -Make any legal decisions or sign important papers.  Meals: Start with liquid foods such as gelatin or soup. Progress to regular foods as tolerated. Avoid greasy, spicy, heavy foods. If nausea and/or vomiting occur, drink only clear liquids until the nausea and/or vomiting subsides. Call your physician if vomiting continues.  Special Instructions/Symptoms: Your throat may feel dry or  sore from the anesthesia or the breathing tube placed in your throat during surgery. If this causes discomfort, gargle with warm salt water. The discomfort should disappear within 24 hours.  If you had a scopolamine patch placed behind your ear for the management of post- operative nausea and/or  vomiting:  1. The medication in the patch is effective for 72 hours, after which it should be removed.  Wrap patch in a tissue and discard in the trash. Wash hands thoroughly with soap and water. 2. You may remove the patch earlier than 72 hours if you experience unpleasant side effects which may include dry mouth, dizziness or visual disturbances. 3. Avoid touching the patch. Wash your hands with soap and water after contact with the patch.    *May have Tylenol today at 1pm

## 2023-08-01 NOTE — H&P (Signed)
 Cc: Chronic rhinosinusitis, sinonasal polyps, bilateral inferior turbinate hypertrophy   HPI: The patient is a 23 year old female who returns today for her follow-up evaluation.  The patient was previously seen for bilateral chronic rhinosinusitis, sinonasal polyps, and chronic nasal obstruction.  The patient was noted to have polypoid tissue obstructing her nasal cavities bilaterally.  She also has bilateral severe inferior turbinate hypertrophy.  She was previously treated with multiple courses of antibiotics, allergy medications, immunotherapy, systemic steroids, and steroid nasal sprays.  She recently underwent a sinus CT scan.  The CT showed bilateral pansinusitis, with opacification of all her paranasal sinuses and sinus outflow tracts.  The patient recently completed a 12-day course of high-dose prednisone.  She returns today complaining of persistent nasal congestion and facial pressure.  She has not responded to medical treatment, and is interested in surgical intervention.   Exam: General: Communicates without difficulty, well nourished, no acute distress. Head: Normocephalic, no evidence injury, no tenderness, facial buttresses intact without stepoff. Face/sinus: No tenderness to palpation and percussion. Facial movement is normal and symmetric. Eyes: PERRL, EOMI. No scleral icterus, conjunctivae clear. Neuro: CN II exam reveals vision grossly intact.  No nystagmus at any point of gaze. Ears: Auricles well formed without lesions.  Ear canals are intact without mass or lesion.  No erythema or edema is appreciated.  The TMs are intact without fluid. Nose: External evaluation reveals normal support and skin without lesions.  Dorsum is intact.  Anterior rhinoscopy reveals congested mucosa over anterior aspect of inferior turbinates and intact septum.  Bilateral nasal polyps and inferior turbinate hypertrophy.  Oral:  Oral cavity and oropharynx are intact, symmetric, without erythema or edema.  Mucosa is  moist without lesions. Neck: Full range of motion without pain.  There is no significant lymphadenopathy.  No masses palpable.  Thyroid bed within normal limits to palpation.  Parotid glands and submandibular glands equal bilaterally without mass.  Trachea is midline. Neuro:  CN 2-12 grossly intact.     Assessment: 1.  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  The patient has not responded to maximal medical treatment for the past year. 2.  Bilateral severe inferior turbinate hypertrophy.   Plan: 1.  The physical exam findings and the CT images are extensively reviewed with the patient. 2.  Continue with her current allergy treatment regimen. 3.  Based on the above findings, the patient will benefit from surgical intervention with bilateral endoscopic sinus surgery and bilateral partial inferior turbinate resection.  The risk, benefits, alternatives, and details of the procedures are explained [place] reviewed.  Questions are invited and answered. 4.  The patient would like to proceed with the procedures.

## 2023-08-01 NOTE — Transfer of Care (Signed)
 Immediate Anesthesia Transfer of Care Note  Patient: April Rush  Procedure(s) Performed: Frederik Schmidt REDUCTION (Bilateral: Nose) SINUS ENDO WITH STEALTH NAVIGATION (Bilateral: Nose) MAXILLARY ANTROSTOMY (Bilateral: Nose) FRONTAL SINUS EXPLORATION (Bilateral: Nose) ETHMOIDECTOMY (Bilateral: Nose) SPHENOIDECTOMY (Bilateral: Nose)  Patient Location: PACU  Anesthesia Type:General  Level of Consciousness: awake, alert , oriented, and patient cooperative  Airway & Oxygen Therapy: Patient Spontanous Breathing and Patient connected to face mask oxygen  Post-op Assessment: Report given to RN and Post -op Vital signs reviewed and stable  Post vital signs: Reviewed and stable  Last Vitals:  Vitals Value Taken Time  BP 144/106 08/01/23 1056  Temp 37.3 C 08/01/23 1056  Pulse 79 08/01/23 1058  Resp 13 08/01/23 1058  SpO2 100 % 08/01/23 1058  Vitals shown include unfiled device data.  Last Pain:  Vitals:   08/01/23 0636  TempSrc: Oral  PainSc: 0-No pain         Complications: No notable events documented.

## 2023-08-01 NOTE — Anesthesia Procedure Notes (Signed)
 Procedure Name: Intubation Date/Time: 08/01/2023 8:51 AM  Performed by: Bishop Limbo, CRNAPre-anesthesia Checklist: Patient identified, Emergency Drugs available, Suction available and Patient being monitored Patient Re-evaluated:Patient Re-evaluated prior to induction Oxygen Delivery Method: Circle System Utilized Preoxygenation: Pre-oxygenation with 100% oxygen Induction Type: IV induction Ventilation: Mask ventilation without difficulty Laryngoscope Size: Mac and 3 Grade View: Grade I Tube type: Oral Tube size: 7.0 mm Number of attempts: 1 Airway Equipment and Method: Stylet Placement Confirmation: ETT inserted through vocal cords under direct vision, positive ETCO2 and breath sounds checked- equal and bilateral Secured at: 22 cm Tube secured with: Tape Dental Injury: Teeth and Oropharynx as per pre-operative assessment

## 2023-08-02 ENCOUNTER — Encounter (HOSPITAL_BASED_OUTPATIENT_CLINIC_OR_DEPARTMENT_OTHER): Payer: Self-pay | Admitting: Otolaryngology

## 2023-08-02 LAB — SURGICAL PATHOLOGY

## 2023-08-03 ENCOUNTER — Telehealth (INDEPENDENT_AMBULATORY_CARE_PROVIDER_SITE_OTHER): Payer: Self-pay | Admitting: Otolaryngology

## 2023-08-03 NOTE — Telephone Encounter (Signed)
 Confirmed appt and address with patient for 08/04/2023.

## 2023-08-04 ENCOUNTER — Encounter (INDEPENDENT_AMBULATORY_CARE_PROVIDER_SITE_OTHER): Payer: Self-pay

## 2023-08-04 ENCOUNTER — Ambulatory Visit (INDEPENDENT_AMBULATORY_CARE_PROVIDER_SITE_OTHER): Payer: Medicaid Other | Admitting: Otolaryngology

## 2023-08-04 VITALS — BP 135/88 | HR 70 | Ht 62.0 in | Wt 207.0 lb

## 2023-08-04 DIAGNOSIS — J324 Chronic pansinusitis: Secondary | ICD-10-CM | POA: Diagnosis not present

## 2023-08-04 DIAGNOSIS — J338 Other polyp of sinus: Secondary | ICD-10-CM

## 2023-08-04 NOTE — Progress Notes (Signed)
 Patient ID: April Rush, female   DOB: [birth year], 22 y.o.   MRN: [id number]  Follow-up: Bilateral pansinusitis and polyposis  Procedure: Bilateral nasal/sinus debridement status post endoscopic sinus surgery.  Indication: The patient previously underwent bilateral endoscopic sinus surgery to treat her chronic pansinusitis and polyposis on 08/01/23.  The patient returns today complaining of significant nasal congestion and crusting.  She had a moderate amount of bleeding from the nasal cavities.  Currently the patient denies any facial pain, fever, or visual change.  Anesthesia: Topical Xylocaine and Neo-Synephrine.  Description: The patient is placed upright in the exam chair. Both nasal cavities are sprayed with topical Xylocaine and Neo-Synephrine.  A 0 rigid endoscope is used for the examination. The scope is first advanced past the right nostril into the right nasal cavity. A significant amount of crusting and blood clots are noted within the right nasal cavity and  the maxillary/ethmoid cavities.  The crusting and blood clots are removed with suction catheters and alligator forceps, which are inserted in parallel with the rigid endoscope.  After the debridement procedure, the maxillary antrum and the ethmoid cavities are noted to be widely patent.  The frontal recess and sphenoid openings are also patent.  The same procedure is then repeated on the left side without exception. Similar findings are again noted on the left. The patient tolerated the procedure well.  Follow up care: The patient is instructed to perform daily nasal saline irrigation.  The patient will return for re-evaluation in approximately 3 weeks.

## 2023-08-09 ENCOUNTER — Other Ambulatory Visit: Payer: Self-pay

## 2023-08-09 ENCOUNTER — Encounter: Payer: Self-pay | Admitting: Oncology

## 2023-08-09 ENCOUNTER — Inpatient Hospital Stay: Payer: Medicaid Other | Attending: Oncology | Admitting: Oncology

## 2023-08-09 ENCOUNTER — Inpatient Hospital Stay: Payer: Medicaid Other

## 2023-08-09 VITALS — BP 118/91 | HR 96 | Temp 98.4°F | Resp 18 | Wt 204.3 lb

## 2023-08-09 DIAGNOSIS — Z79899 Other long term (current) drug therapy: Secondary | ICD-10-CM | POA: Insufficient documentation

## 2023-08-09 DIAGNOSIS — D72829 Elevated white blood cell count, unspecified: Secondary | ICD-10-CM | POA: Diagnosis not present

## 2023-08-09 DIAGNOSIS — D582 Other hemoglobinopathies: Secondary | ICD-10-CM | POA: Diagnosis not present

## 2023-08-09 DIAGNOSIS — D75839 Thrombocytosis, unspecified: Secondary | ICD-10-CM | POA: Insufficient documentation

## 2023-08-09 DIAGNOSIS — F17290 Nicotine dependence, other tobacco product, uncomplicated: Secondary | ICD-10-CM | POA: Diagnosis not present

## 2023-08-09 LAB — CBC WITH DIFFERENTIAL (CANCER CENTER ONLY)
Abs Immature Granulocytes: 0.11 10*3/uL — ABNORMAL HIGH (ref 0.00–0.07)
Basophils Absolute: 0.2 10*3/uL — ABNORMAL HIGH (ref 0.0–0.1)
Basophils Relative: 1 %
Eosinophils Absolute: 1.3 10*3/uL — ABNORMAL HIGH (ref 0.0–0.5)
Eosinophils Relative: 9 %
HCT: 39.2 % (ref 36.0–46.0)
Hemoglobin: 14 g/dL (ref 12.0–15.0)
Immature Granulocytes: 1 %
Lymphocytes Relative: 36 %
Lymphs Abs: 5.1 10*3/uL — ABNORMAL HIGH (ref 0.7–4.0)
MCH: 27 pg (ref 26.0–34.0)
MCHC: 35.7 g/dL (ref 30.0–36.0)
MCV: 75.5 fL — ABNORMAL LOW (ref 80.0–100.0)
Monocytes Absolute: 1 10*3/uL (ref 0.1–1.0)
Monocytes Relative: 7 %
Neutro Abs: 6.4 10*3/uL (ref 1.7–7.7)
Neutrophils Relative %: 46 %
Platelet Count: 529 10*3/uL — ABNORMAL HIGH (ref 150–400)
RBC: 5.19 MIL/uL — ABNORMAL HIGH (ref 3.87–5.11)
RDW: 13.4 % (ref 11.5–15.5)
WBC Count: 14 10*3/uL — ABNORMAL HIGH (ref 4.0–10.5)
nRBC: 0 % (ref 0.0–0.2)

## 2023-08-09 LAB — TECHNOLOGIST SMEAR REVIEW: Plt Morphology: INCREASED

## 2023-08-09 NOTE — Assessment & Plan Note (Signed)
Likely reactive 

## 2023-08-09 NOTE — Assessment & Plan Note (Signed)
No intervention needed.  Recommend genetic counseling when she plans family

## 2023-08-09 NOTE — Assessment & Plan Note (Addendum)
 Labs are reviewed and discussed with patient. Patient has normal LDH, peripheral flowcytometry immunophenotypic abnormality, negative hepatitis, negative HIV, no M protein on monoclonal gammopathy workup, negative BCR-ABL, normal ANA, ESR, CRP Normal tryptase, negative JAK2 mutation with reflex, negative CHIC2 or rearrangement of PDGFRA, PDGFRB, or FGFR1,  Eosinophilia, most like due to chronic inflammation, allergy, chronic sinusitis. Increase leukocytosis is likely due to recent sinus procedure.  Recommend observation.

## 2023-08-09 NOTE — Progress Notes (Signed)
 Hematology/Oncology Progress note [phone] [phone]        REFERRING PROVIDER: Nira Retort   CHIEF COMPLAINTS/REASON FOR VISIT:  Leukocytosis, thrombocytosis  ASSESSMENT & PLAN:   Leukocytosis Labs are reviewed and discussed with patient. Patient has normal LDH, peripheral flowcytometry immunophenotypic abnormality, negative hepatitis, negative HIV, no M protein on monoclonal gammopathy workup, negative BCR-ABL, normal ANA, ESR, CRP Normal tryptase, negative JAK2 mutation with reflex, negative CHIC2 or rearrangement of PDGFRA, PDGFRB, or FGFR1,  Eosinophilia, most like due to chronic inflammation, allergy, chronic sinusitis. Increase leukocytosis is likely due to recent sinus procedure.  Recommend observation.   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  Recommend genetic counseling when she plans family  Thrombocytosis Likely reactive   Orders Placed This Encounter  Procedures   CBC with Differential (Cancer Center Only)    Standing Status:   Future    Expected Date:   02/09/2024    Expiration Date:   08/08/2024   Technologist smear review    Standing Status:   Future    Expected Date:   02/09/2024    Expiration Date:   08/08/2024    Clinical information::   leukocytosis   Technologist smear review    Standing Status:   Future    Number of Occurrences:   1    Expected Date:   08/09/2023    Expiration Date:   08/08/2024    Clinical information::   leukocytosis    Return of visit: follow up in 6 months [facility] All questions were answered. The patient knows to call the clinic with any problems, questions or concerns.  Rickard Patience, MD, PhD [facility] Hematology Oncology 08/09/2023    HISTORY OF PRESENTING ILLNESS:  April Rush is a  23 y.o.  female with PMH listed below who was referred to me for evaluation of leukocytosis Reviewed patient' recent labs obtained by PCP.  10/07/2022 CBC showed elevated  white count of 13.9, hemoglobin 14.6, MCV 77, platelet 166, vitamin B12 330 Iron saturation 20, ferritin 96.  Differential showed increased absolute lymphocyte, eosinophils, basophils. Previous lab records reviewed. Leukocytosis is new onset since March 2024.  Patient had normal white count in 2019, 2021, 2023.  She had 1 episode of mild leukocytosis with white count of 11.4 on 05/10/2017. 09/03/2014, WBC 13.8.  Patient denies unintentional weight loss, fever, chills.  She feels hot at night but denies any soaking sweat She denies smoking, recreational drug use, chronic oral steroid use.  She uses steroid inhaler twice daily since September 2023 She also has history of rash/hives and she follows up with immunology for allergy shots. Patient noticed nauseated, near syncope episodes.  Mother reports purple fingertips during these episodes.  She sleeps all the time.  Fatigued.   multiple complaints.  She reports and shakes, really fatigued.  Dizzy and passing out episodes. She feels that these new symptoms has replaced her old symptoms. No unintentional weight loss, night sweats or fever. + nasal congestion.  11/05/2022 MRI brain without contrast showed no evidence of intracranial abnormality.  Severe paranasal sinus mucosal thickening.  INTERVAL HISTORY April Rush is a 23 y.o. female who has above history reviewed by me today presents for follow up visit for eosinophilia, thrombocytosis and RBC microcytosis. 08/01/2023  had sinus procedure. Pathology showed chronic sinusitis with eosinophilia.  Nasal congestion has improved.  Night sweat has resolved.    MEDICAL HISTORY:  Past Medical History:  Diagnosis Date   Anxiety  Asthma    Hemoglobin C trait (HCC)    followed by oncology   Hypertension    white coat syndrome   Patient denies medical problems    Tachycardia     SURGICAL HISTORY: Past Surgical History:  Procedure Laterality Date   ETHMOIDECTOMY Bilateral 08/01/2023    Procedure: ETHMOIDECTOMY;  Surgeon: Newman Pies, MD;  Location: [facility];  Service: ENT;  Laterality: Bilateral;   FRONTAL SINUS EXPLORATION Bilateral 08/01/2023   Procedure: FRONTAL SINUS EXPLORATION;  Surgeon: Newman Pies, MD;  Location: [facility];  Service: ENT;  Laterality: Bilateral;   MAXILLARY ANTROSTOMY Bilateral 08/01/2023   Procedure: MAXILLARY ANTROSTOMY;  Surgeon: Newman Pies, MD;  Location: [facility];  Service: ENT;  Laterality: Bilateral;   SINUS ENDO WITH FUSION Bilateral 08/01/2023   Procedure: SINUS ENDO WITH STEALTH NAVIGATION;  Surgeon: Newman Pies, MD;  Location: [facility];  Service: ENT;  Laterality: Bilateral;   SPHENOIDECTOMY Bilateral 08/01/2023   Procedure: SPHENOIDECTOMY;  Surgeon: Newman Pies, MD;  Location: [facility];  Service: ENT;  Laterality: Bilateral;   TONSILLECTOMY     Adenoidectomy   TURBINATE REDUCTION Bilateral 08/01/2023   Procedure: TURBINATE REDUCTION;  Surgeon: Newman Pies, MD;  Location: [facility];  Service: ENT;  Laterality: Bilateral;    SOCIAL HISTORY: Social History   Socioeconomic History   Marital status: Single    Spouse name: Not on file   Number of children: Not on file   Years of education: Not on file   Highest education level: Not on file  Occupational History   Occupation: student  Tobacco Use   Smoking status: Never   Smokeless tobacco: Never  Vaping Use   Vaping status: Every Day   Substances: Nicotine, Flavoring  Substance and Sexual Activity   Alcohol use: Not Currently    Comment: "tried a sip" before   Drug use: Never   Sexual activity: Yes    Partners: Male    Birth control/protection: Injection  Other Topics Concern   Not on file  Social History Narrative   Not on file   Social Drivers of Health   Financial Resource Strain: Low Risk  (09/30/2022)   Received from [facility]   Overall  Financial Resource Strain (CARDIA)    Difficulty of Paying Living Expenses: Not hard at all  Food Insecurity: No Food Insecurity (11/10/2022)   Hunger Vital Sign    Worried About Running Out of Food in the Last Year: Never true    Ran Out of Food in the Last Year: Never true  Transportation Needs: No Transportation Needs (11/10/2022)   PRAPARE - Administrator, Civil Service (Medical): No    Lack of Transportation (Non-Medical): No  Physical Activity: Not on file  Stress: Not on file  Social Connections: Not on file  Intimate Partner Violence: Not At Risk (11/10/2022)   Humiliation, Afraid, Rape, and Kick questionnaire    Fear of Current or Ex-Partner: No    Emotionally Abused: No    Physically Abused: No    Sexually Abused: No    FAMILY HISTORY: Family History  Problem Relation Age of Onset   Cirrhosis Maternal Grandfather        not related to alcohol   Hypertension Father     ALLERGIES:  is allergic to pollen extract-tree extract [pollen extract] and melatonin.  MEDICATIONS:  Current Outpatient Medications  Medication Sig Dispense  Refill   azelastine (ASTELIN) 0.1 % nasal spray Place 1 spray into both nostrils 2 (two) times daily. Use in each nostril as directed 30 mL 12   budesonide-formoterol (SYMBICORT) 160-4.5 MCG/ACT inhaler Inhale 2 puffs into the lungs 2 (two) times daily.     nortriptyline (PAMELOR) 10 MG capsule Start Nortriptyline (Pamelor) 10 mg nightly for one week, then increase to 20 mg nightly     escitalopram (LEXAPRO) 10 MG tablet Take 10 mg by mouth daily. (Patient not taking: Reported on 08/09/2023)     No current facility-administered medications for this visit.    Review of Systems  Constitutional:  Negative for appetite change, chills, fatigue and fever.  HENT:   Negative for hearing loss and voice change.   Eyes:  Negative for eye problems.  Respiratory:  Negative for chest tightness and cough.   Cardiovascular:  Negative for chest pain.   Gastrointestinal:  Negative for abdominal distention, abdominal pain, blood in stool and nausea.  Endocrine: Negative for hot flashes.  Genitourinary:  Negative for difficulty urinating and frequency.   Musculoskeletal:  Negative for arthralgias.  Skin:  Negative for itching and rash.  Neurological:  Negative for extremity weakness and light-headedness.  Hematological:  Negative for adenopathy.  Psychiatric/Behavioral:  Negative for confusion.     PHYSICAL EXAMINATION:  Vitals:   08/09/23 1501  BP: (!) 118/91  Pulse: 96  Resp: 18  Temp: 98.4 F (36.9 C)  SpO2: 97%   Filed Weights   08/09/23 1501  Weight: 204 lb 4.8 oz (92.7 kg)    Physical Exam Constitutional:      General: She is not in acute distress. HENT:     Head: Normocephalic and atraumatic.  Eyes:     General: No scleral icterus. Cardiovascular:     Rate and Rhythm: Normal rate and regular rhythm.     Heart sounds: Normal heart sounds.  Pulmonary:     Effort: Pulmonary effort is normal. No respiratory distress.  Abdominal:     General: There is no distension.  Musculoskeletal:        General: Normal range of motion.     Cervical back: Normal range of motion and neck supple.  Skin:    Findings: No rash.  Neurological:     Mental Status: She is alert and oriented to person, place, and time. Mental status is at baseline.  Psychiatric:        Mood and Affect: Mood normal.      RADIOGRAPHIC STUDIES: I have personally reviewed the radiological images as listed and agreed with the findings in the report. No results found.  LABORATORY DATA:  I have reviewed the data as listed    Latest Ref Rng & Units 08/09/2023    2:57 PM 11/10/2022   11:49 AM 08/25/2022    5:51 PM  CBC  WBC 4.0 - 10.5 K/uL 14.0  9.9  14.4   Hemoglobin 12.0 - 15.0 g/dL 16.1  09.6  04.5   Hematocrit 36.0 - 46.0 % 39.2  41.1  42.3   Platelets 150 - 400 K/uL 529  446  445       Latest Ref Rng & Units 11/10/2022   11:49 AM 08/25/2022     5:51 PM 04/26/2022    9:41 PM  CMP  Glucose 70 - 99 mg/dL 94  409  811   BUN 6 - 20 mg/dL 11  11  9    Creatinine 0.44 - 1.00 mg/dL 9.14  7.82  0.82   Sodium 135 - 145 mmol/L 138  139  136   Potassium 3.5 - 5.1 mmol/L 3.9  3.9  3.6   Chloride 98 - 111 mmol/L 112  112  107   CO2 22 - 32 mmol/L 18  18  20    Calcium 8.9 - 10.3 mg/dL 9.2  7.8  9.0   Total Protein 6.5 - 8.1 g/dL 7.5     Total Bilirubin 0.3 - 1.2 mg/dL 0.8     Alkaline Phos 38 - 126 U/L 78     AST 15 - 41 U/L 20     ALT 0 - 44 U/L 25

## 2023-08-29 ENCOUNTER — Ambulatory Visit (INDEPENDENT_AMBULATORY_CARE_PROVIDER_SITE_OTHER): Admitting: Otolaryngology

## 2023-08-29 VITALS — BP 138/78 | HR 82

## 2023-08-29 DIAGNOSIS — Z9889 Other specified postprocedural states: Secondary | ICD-10-CM

## 2023-08-29 DIAGNOSIS — J324 Chronic pansinusitis: Secondary | ICD-10-CM

## 2023-08-29 DIAGNOSIS — J338 Other polyp of sinus: Secondary | ICD-10-CM

## 2023-08-29 NOTE — Progress Notes (Signed)
 Patient ID: April Rush, female   DOB: [birth year], 22 y.o.   MRN: [id number]  Follow-up: Bilateral chronic pansinusitis and polyposis   Procedure: Bilateral nasal/sinus debridement status post endoscopic sinus surgery.   Indication: The patient previously underwent bilateral endoscopic sinus surgery to treat her bilateral chronic pansinusitis and polyposis on 08/01/23.  The patient returns today reporting improvement in her nasal congestion and crusting.  She had no significant bleeding from the nasal cavities.  Currently the patient denies any facial pain, fever, or visual change.   Anesthesia: Topical Xylocaine and Neo-Synephrine.   Description: The patient is placed upright in the exam chair. Both nasal cavities are sprayed with topical Xylocaine and Neo-Synephrine.  A 0 rigid endoscope is used for the examination. The scope is first advanced past the right nostril into the right nasal cavity. A moderate amount of crusting is noted within the right nasal cavity and  the maxillary/ethmoid cavities.  The crusting is removed with suction catheters and alligator forceps, which are inserted in parallel with the rigid endoscope.  After the debridement procedure, the maxillary antrum and the ethmoid cavities are noted to be patent.  The frontal recess and sphenoid openings are also patent.  The same procedure is then repeated on the left side without exception. Similar findings are again noted on the left. The patient tolerated the procedure well.   Follow up care: The patient is instructed to continue daily nasal saline irrigation.  The patient will return for re-evaluation in approximately 4 weeks.

## 2023-08-30 DIAGNOSIS — J301 Allergic rhinitis due to pollen: Secondary | ICD-10-CM | POA: Diagnosis not present

## 2023-08-30 DIAGNOSIS — J3089 Other allergic rhinitis: Secondary | ICD-10-CM | POA: Diagnosis not present

## 2023-08-30 DIAGNOSIS — J4520 Mild intermittent asthma, uncomplicated: Secondary | ICD-10-CM | POA: Diagnosis not present

## 2023-08-30 DIAGNOSIS — J3081 Allergic rhinitis due to animal (cat) (dog) hair and dander: Secondary | ICD-10-CM | POA: Diagnosis not present

## 2023-10-04 ENCOUNTER — Encounter (INDEPENDENT_AMBULATORY_CARE_PROVIDER_SITE_OTHER): Payer: Self-pay

## 2023-10-04 ENCOUNTER — Ambulatory Visit (INDEPENDENT_AMBULATORY_CARE_PROVIDER_SITE_OTHER): Admitting: Otolaryngology

## 2023-10-04 VITALS — HR 80 | Ht 62.0 in | Wt 208.0 lb

## 2023-10-04 DIAGNOSIS — J324 Chronic pansinusitis: Secondary | ICD-10-CM | POA: Diagnosis not present

## 2023-10-04 DIAGNOSIS — J338 Other polyp of sinus: Secondary | ICD-10-CM | POA: Diagnosis not present

## 2023-10-05 NOTE — Progress Notes (Signed)
 Patient ID: April Rush, female   DOB: 2000/11/09, 22 y.o.   MRN: [id number]  Follow-up: Bilateral chronic pansinusitis and polyposis   Procedure: Bilateral nasal/sinus debridement status post endoscopic sinus surgery.   Indication: The patient is a 23 year old female with a history of bilateral chronic pansinusitis and polyposis.  She underwent bilateral endoscopic sinus surgery on 08/01/23.  The patient returns today reporting continuing improvement in her nasal congestion and crusting.  She had no significant bleeding from the nasal cavities.  Currently the patient denies any facial pain, fever, or visual change.   Anesthesia: Topical Xylocaine  and Neo-Synephrine.   Description: The patient is placed upright in the exam chair. Both nasal cavities are sprayed with topical Xylocaine  and Neo-Synephrine.  A 0 rigid endoscope is used for the examination. The scope is first advanced past the right nostril into the right nasal cavity. A small amount of crusting is noted within the right nasal cavity and  the maxillary/ethmoid cavities.  The crusting is removed with suction catheters and alligator forceps, which are inserted in parallel with the rigid endoscope.  After the debridement procedure, the maxillary antrum and the ethmoid cavities are noted to be patent.  The frontal recess and sphenoid openings are also patent.  The same procedure is then repeated on the left side without exception. Similar findings are again noted on the left. The patient tolerated the procedure well.   Follow up care: The patient is instructed to continue as needed nasal saline irrigation.  The patient will return for re-evaluation in approximately 4 months.

## 2023-12-27 NOTE — Progress Notes (Signed)
 Chief Complaint  Patient presents with  . Annual Exam    April Rush is a 23 y.o. female who is here for an annual physical.  1. Asthma/sinusitis: She is followed by pulmonology and is prescribed Symbicort 2 puffs BID and albuterol inhaler as needed (rarely uses this) - she has been prescribed singulair but does not regularly take this. She was hospitalized in 01/2022 for severe asthma exacerbation. Her asthma is triggered by allergies and weather changes. She is not taking any oral antihistamine or nasal sprays. She is followed by ENT and underwent turbinate reduction 07/2023.    2. Birth control: She was previously doing depo-provera injections (followed by the health department) but discontinued around 03/2023 as she is trying to conceive. She has struggled with pregnancy and has been advised to see genetic counseling given history of hemoglobin C trait.   3. GERD: She uses tums as needed, typically 3 times per week. She notes that water can trigger her symptoms.    4. HLD: She has a history of elevated cholesterol levels - LDL from 09/2022 elevated at 140. No recent labs.   5. Seizure like activity: She is followed by neurology. She was previously prescribed Lexapro, Nortriptyline, and Effexor but is not currently on any medication. She denies any recent issues.   6. Leukocytosis: She is followed by oncology for chronic leukocytosis and eosinophilia, likely due to chronic inflammation, allergies, and chronic sinusitis.  7. Vitamin deficiency: Her vitamin D level from 09/2022 was low at 21.5. She is not currently taking any supplements.    Preventive Care:  - Dentist: no regular visits, brushes teeth twice daily  - Ophthalmology: does not wear contacts or glasses, no difficulties with vision, no annual exams  - Pap smear: never, not followed by OB/GYN, will update today  - Mammogram: never  - Colonoscopy: never  - Exercise: walks her dogs 3-4x per day for 15 minutes, no formal exercise    - Healthy food choices: Good. Eats one large meal per day (dinner 6pm), small meals throughout the day. Drinks 60-90 fl oz of water during the day. She drinks 2 cans of soda daily, no additional sweet tea or juice. Occasionally coffee.  - Tdap: 09/30/2022  - COVID: declines  - HPV: completed 3 doses   Patient Active Problem List  Diagnosis  . Dysautonomia (CMS/HHS-HCC)  . Vasovagal near syncope  . Hypercholesteremia  . Moderate persistent asthma without complication (HHS-HCC)  . Chronic allergic rhinitis  . Hemoglobin C trait ()  . Vitamin D deficiency    Past Medical History:  Diagnosis Date  . Allergy   . Anemia   . Asthma, unspecified asthma severity, unspecified whether complicated, unspecified whether persistent (HHS-HCC)   . EBV infection    diagnosed with mono based on lab results  . GERD (gastroesophageal reflux disease)   . Headache, unspecified   . Hemoglobin C (Hb-C) ()   . Hyperlipidemia   . Irregular heart rhythm     Past Surgical History:  Procedure Laterality Date  . TURBINATE REDUCTION  08/01/2023  . ADENOIDECTOMY    . TONSILLECTOMY      Family History  Problem Relation Name Age of Onset  . High blood pressure (Hypertension) Father    . Allergies Father    . Hyperlipidemia (Elevated cholesterol) Father    . Diverticulitis Maternal Grandmother    . Crohn's disease Maternal Grandmother    . High blood pressure (Hypertension) Maternal Grandmother    . Thyroid disease  Maternal Grandmother    . Atrial fibrillation (Abnormal heart rhythm sometimes requiring treatment with blood thinners) Maternal Grandmother    . Lupus Maternal Grandmother    . Graves' disease Maternal Grandfather    . Mitral valve prolapse Maternal Grandfather    . Hyperlipidemia (Elevated cholesterol) Maternal Grandfather    . Cirrhosis Maternal Grandfather         non-alcoholic  . Diabetes type II Paternal Grandmother    . Hyperlipidemia (Elevated cholesterol) Paternal  Grandmother    . Cerebral aneurysm Paternal Grandfather    . Irregular Heart Beat (Arrhythmia) Cousin         SVT requiring RF ablation; also treated for dysautonomia    Social History   Socioeconomic History  . Marital status: Life Partner  Tobacco Use  . Smoking status: Never  . Smokeless tobacco: Never  . Tobacco comments:    uses a vape  Vaping Use  . Vaping status: Every Day  Substance and Sexual Activity  . Alcohol use: Never  . Drug use: Defer  . Sexual activity: Yes    Partners: Male    Birth control/protection: None  Social History Narrative   Lives with older brother, mother, and father.    Social Drivers of Corporate investment banker Strain: Low Risk  (12/27/2023)   Overall Financial Resource Strain (CARDIA)   . Difficulty of Paying Living Expenses: Not hard at all  Food Insecurity: No Food Insecurity (12/27/2023)   Hunger Vital Sign   . Worried About Programme researcher, broadcasting/film/video in the Last Year: Never true   . Ran Out of Food in the Last Year: Never true  Transportation Needs: No Transportation Needs (12/27/2023)   PRAPARE - Transportation   . Lack of Transportation (Medical): No   . Lack of Transportation (Non-Medical): No  Housing Stability: Low Risk  (12/27/2023)   Housing Stability Vital Sign   . Unable to Pay for Housing in the Last Year: No   . Number of Times Moved in the Last Year: 0   . Homeless in the Last Year: No    Current Outpatient Medications  Medication Sig Dispense Refill  . albuterol MDI, PROVENTIL, VENTOLIN, PROAIR, HFA 90 mcg/actuation inhaler Inhale 2 inhalations into the lungs every 6 (six) hours as needed for Wheezing (Patient not taking: Reported on 11/15/2023) 1 each 0  . budesonide-formoteroL (SYMBICORT) 160-4.5 mcg/actuation inhaler Inhale 2 inhalations into the lungs 2 (two) times daily     No current facility-administered medications for this visit.    Allergies  Allergen Reactions  . Bee Pollen Shortness Of Breath  . Dog  Epithelium Allergenic Extract Itching  . Melatonin Hives and Rash    Results for orders placed or performed in visit on 11/15/23  CBC w/auto Differential (5 Part)  Result Value Ref Range   WBC (White Blood Cell Count) 11.1 (H) 4.1 - 10.2 10^3/uL   RBC (Red Blood Cell Count) 5.52 (H) 4.04 - 5.48 10^6/uL   Hemoglobin 14.7 12.0 - 15.0 gm/dL   Hematocrit 58.3 64.9 - 47.0 %   MCV (Mean Corpuscular Volume) 75.4 (L) 80.0 - 100.0 fl   MCH (Mean Corpuscular Hemoglobin) 26.6 (L) 27.0 - 31.2 pg   MCHC (Mean Corpuscular Hemoglobin Concentration) 35.3 32.0 - 36.0 gm/dL   Platelet Count 475 (H) 150 - 450 10^3/uL   RDW-CV (Red Cell Distribution Width) 14.1 11.6 - 14.8 %   MPV (Mean Platelet Volume) 10.0 9.4 - 12.4 fl   Neutrophils 5.45 1.50 -  7.80 10^3/uL   Lymphocytes 3.64 (H) 1.00 - 3.60 10^3/uL   Monocytes 0.61 0.00 - 1.50 10^3/uL   Eosinophils 1.21 (H) 0.00 - 0.55 10^3/uL   Basophils 0.17 (H) 0.00 - 0.09 10^3/uL   Neutrophil % 49.1 32.0 - 70.0 %   Lymphocyte % 32.8 10.0 - 50.0 %   Monocyte % 5.5 4.0 - 13.0 %   Eosinophil % 10.9 (H) 1.0 - 5.0 %   Basophil% 1.5 0.0 - 2.0 %   Immature Granulocyte % 0.2 <=0.7 %   Immature Granulocyte Count 0.02 <=0.06 10^3/L  Comprehensive Metabolic Panel (CMP)  Result Value Ref Range   Glucose 81 70 - 110 mg/dL   Sodium 861 863 - 854 mmol/L   Potassium 4.2 3.6 - 5.1 mmol/L   Chloride 108 97 - 109 mmol/L   Carbon Dioxide (CO2) 21.5 (L) 22.0 - 32.0 mmol/L   Urea Nitrogen (BUN) 9 7 - 25 mg/dL   Creatinine 0.7 0.6 - 1.1 mg/dL   Glomerular Filtration Rate (eGFR) 126 >60 mL/min/1.73sq m   Calcium 9.6 8.7 - 10.3 mg/dL   AST  17 8 - 39 U/L   ALT  19 5 - 38 U/L   Alk Phos (alkaline Phosphatase) 111 (H) 34 - 104 U/L   Albumin 4.6 3.5 - 4.8 g/dL   Bilirubin, Total 0.6 0.3 - 1.2 mg/dL   Protein, Total 7.6 6.1 - 7.9 g/dL   A/G Ratio 1.5 1.0 - 5.0 gm/dL  Hemoglobin J8R  Result Value Ref Range   Hemoglobin A1C 5.2 4.2 - 5.6 %   Average Blood Glucose (Calc) 103  mg/dL   Narrative   Normal Range:    4.2 - 5.6% Increased Risk:  5.7 - 6.4% Diabetes:        >= 6.5% Glycemic Control for adults with diabetes:  <7%    Thyroid Stimulating-Hormone (TSH)  Result Value Ref Range   Thyroid Stimulating Hormone (TSH) 2.439 0.450-5.330 uIU/ml uIU/mL    BP Readings from Last 3 Encounters:  12/27/23 108/68  11/15/23 118/86  03/21/23 (!) 140/96    Wt Readings from Last 3 Encounters:  12/27/23 92.8 kg (204 lb 9.6 oz)  11/15/23 93.3 kg (205 lb 9.6 oz)  03/21/23 92.5 kg (204 lb)     PHQ 2/9 from today's flowsheet  PHQ-2 PHQ-2 Over the last 2 weeks, how often have you been bothered by any of the following problems? Little interest or pleasure in doing things: Not at all Feeling down, depressed, or hopeless: Not at all Patient Health Questionnaire-2 Score: 0  PHQ-9 (if PHQ >=3)    Depression Severity and Treatment Recommendations:  0-4= None  5-9= Mild / Treatment: Support, educate to call if worse; return in one month  10-14= Moderate / Treatment: Support, watchful waiting; Antidepressant or Psychotherapy  15-19= Moderately severe / Treatment: Antidepressant OR Psychotherapy  >= 20 = Major depression, severe / Antidepressant AND Psychotherapy   Goals Addressed               This Visit's Progress     Patient Stated   . * Patient Goals (pt-stated)        none          Review of Systems:    Constitutional: No fever, No fatigue, No night sweats, No weakness, No significant weight change.  Skin: No rash, No suspicious or changing lesions.  HEENT: No change in vision, No headaches, No mouth sores, No difficulty swallowing.  Cardiovascular: No chest pain, No  palpitations, No lower extremity edema, No dyspnea on exertion.  Respiratory: No shortness of breath, No cough, No hemoptysis.  GI: No blood in stool, No significant change in bowel habits, No constipation, No diarrhea, No heartburn, No vomiting.  GU: No difficulty  urinating, No dysuria, No hematuria, No urinary urgency, No urinary frequency.  MSK: No joint pain, No joint stiffness, No joint swelling.  Neurologic: No numbness or tingling, No memory loss, No seizures, No tremor.  Psychologic: No depression, No anxiety, No sleep disturbance.    Physical Exam:    Vitals:   12/27/23 1356  BP: 108/68  BP Location: Left upper arm  Patient Position: Sitting  BP Cuff Size: Adult  Pulse: 104  SpO2: 98%  Weight: 92.8 kg (204 lb 9.6 oz)  Height: 156.8 cm (5' 1.73)    Body mass index is 37.75 kg/m.  General: Well-appearing, well-developed, well-nourished, alert and oriented, appears stated age, no acute distress.  Skin: No obvious or visible rash or concerning appearing skin lesions on extremities.   HEENT: Head is normocephalic and atraumatic. Ear canals clear bilaterally, TM's clear without abnormality, neck supple with no enlarged cervical lymph nodes, thyroid non-tender without nodules. Eyes clear without injection, PERRL.  Cardiovascular: Normal S1 and S2 with regular rhythm and normal rate with no murmur, No JVD. Extremities warm without edema. Pedal pulses intact bilaterally.   Respiratory: Clear to auscultation bilaterally with no wheezing, rhonchi, or crackles. No increased work of breathing. Good air movement.  GI: Abdomen soft, non-tender, non-distended, normal bowel sounds present. No masses or organomegaly appreciated.  GU: External genitalia without erythema, exudate or discharge. Vaginal vault is without discharge. Cervix is of normal color without lesions. There is no bleeding noted. Female chaperone, Nat Fare RMA, present during entire portion of GU exam.   MSK: No joint erythema, normal range of motion without pain in upper and lower extremities.  Neurologic: Alert and oriented x 3, cranial nerves grossly intact without focal abnormalities.   Psych: Affect appropriate, mood normal, insight normal, judgment  normal.   Assessment/Plan:   1. Annual physical exam Enos is here for annual exam. Reviewed labs from 10/2023.  2. Moderate persistent asthma without complication (HHS-HCC) 3. Chronic sinusitis, unspecified location 4. Chronic allergic rhinitis 5. Leukocytosis, unspecified type She is followed by pulmonology and uses Symbicort inhaler and albuterol as needed. Her chronic leukocytosis is likely secondary to chronic sinusitis.   6. Thrombocytosis Stable. Followed by oncology, likely reactive.   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 She has been advised to undergo genetic testing when she plans to start a family. She and her fiance are interested in conception. Referral placed in 10/2023 for further evaluation.   8. Hypercholesteremia Her LDL from 09/2022 was elevated at 140. Recommend dietary/lifestyle changes.  9. Gastroesophageal reflux disease, unspecified whether esophagitis present Stable. She uses tums as needed.  10. Class 2 obesity due to excess calories without serious comorbidity with body mass index (BMI) of 37.0 to 37.9 in adult Recommend healthy lifestyle with regular exercise and 6 small, well-balanced meals during the day.  11. Vitamin D deficiency Recommend she take multivitamin daily or D3 2,000 IU daily.  12. Screening for depression -     Depression Screen -(PHQ- 2/9, BDI)  13. Screening for cervical cancer -     IGP, rfx Aptima HPV ASCU - [facility]    Wellness exam:  Diet and Exercise: I discussed the importance of  a healthy lifestyle including appropriate food choices and regular exercise. I recommended a diet that is moderate in fiber with fresh fruits and vegetables and low in saturated fats and simple carbohydrates. I recommended getting some form of exercise daily and moderate-to-high intensity exercise 5-7 days per week to include a total of at least 150 minutes per week.   Vaccines/Immunizations: The  patient is up to date on age-appropriate screening exams and vaccines.    Follow-up in 1 year for annual exam, sooner as needed.    Edsel Pepper, PA-C *Some images could not be shown.

## 2024-01-10 ENCOUNTER — Encounter (INDEPENDENT_AMBULATORY_CARE_PROVIDER_SITE_OTHER): Payer: Self-pay | Admitting: Otolaryngology

## 2024-01-10 ENCOUNTER — Ambulatory Visit (INDEPENDENT_AMBULATORY_CARE_PROVIDER_SITE_OTHER): Admitting: Otolaryngology

## 2024-01-10 VITALS — HR 70 | Ht 61.0 in | Wt 207.0 lb

## 2024-01-10 DIAGNOSIS — R0981 Nasal congestion: Secondary | ICD-10-CM | POA: Diagnosis not present

## 2024-01-10 DIAGNOSIS — J324 Chronic pansinusitis: Secondary | ICD-10-CM

## 2024-01-10 DIAGNOSIS — J338 Other polyp of sinus: Secondary | ICD-10-CM

## 2024-01-10 DIAGNOSIS — J310 Chronic rhinitis: Secondary | ICD-10-CM | POA: Diagnosis not present

## 2024-01-10 MED ORDER — FLUTICASONE PROPIONATE 50 MCG/ACT NA SUSP: 2.0000 | Freq: Every day | NASAL | 10 refills | Status: AC

## 2024-01-12 NOTE — Progress Notes (Signed)
 Patient ID: April Rush, female   DOB: [birth year], 23 y.o.   MRN: [id number]  Follow-up: Bilateral chronic pansinusitis and polyposis   HPI: The patient is a 23 year old female who returns today for her follow-up evaluation.  The patient has a history of bilateral chronic pansinusitis and polyposis.  She underwent endoscopic sinus surgery in March 2025.  The patient returns today reporting no recurrent sinusitis since her surgery.  She has occasional nasal drainage.  Currently she denies any facial pain, fever, or visual change.  She is able to breathe through both nostrils.  Exam: General: Communicates without difficulty, well nourished, no acute distress. Head: Normocephalic, no evidence injury, no tenderness, facial buttresses intact without stepoff. Face/sinus: No tenderness to palpation and percussion. Facial movement is normal and symmetric. Eyes: PERRL, EOMI. No scleral icterus, conjunctivae clear. Neuro: CN II exam reveals vision grossly intact.  No nystagmus at any point of gaze. Ears: Auricles well formed without lesions.  Ear canals are intact without mass or lesion.  No erythema or edema is appreciated.  The TMs are intact without fluid. Nose: External evaluation reveals normal support and skin without lesions.  Dorsum is intact.  Anterior rhinoscopy reveals congested mucosa over anterior aspect of inferior turbinates and intact septum.  No purulence noted. Oral:  Oral cavity and oropharynx are intact, symmetric, without erythema or edema.  Mucosa is moist without lesions. Neck: Full range of motion without pain.  There is no significant lymphadenopathy.  No masses palpable.  Thyroid bed within normal limits to palpation.  Parotid glands and submandibular glands equal bilaterally without mass.  Trachea is midline. Neuro:  CN 2-12 grossly intact.   Assessment: 1.  Bilateral chronic rhinosinusitis and polyposis, status post endoscopic sinus surgery in March 2025. 2.  The patient is noted to  have mild nasal mucosal congestion.  Her nasal passageways are patent bilaterally.  No recurrent polyposis or infection is noted.  Plan: 1.  The physical exam findings are reviewed with the patient. 2.  Restart Flonase nasal spray 2 sprays each nostril daily.  The importance of consistent daily use is discussed. 3.  Nasal saline irrigation as needed. 4.  The patient will return for reevaluation in 6 months.

## 2024-02-07 ENCOUNTER — Inpatient Hospital Stay

## 2024-02-07 ENCOUNTER — Inpatient Hospital Stay: Admitting: Oncology

## 2024-03-06 ENCOUNTER — Inpatient Hospital Stay: Admitting: Oncology

## 2024-03-06 ENCOUNTER — Encounter: Payer: Self-pay | Admitting: Oncology

## 2024-03-06 ENCOUNTER — Inpatient Hospital Stay: Attending: Oncology

## 2024-03-06 VITALS — BP 116/84 | HR 85 | Temp 98.9°F | Resp 17 | Wt 204.6 lb

## 2024-03-06 DIAGNOSIS — D75839 Thrombocytosis, unspecified: Secondary | ICD-10-CM

## 2024-03-06 DIAGNOSIS — Z79899 Other long term (current) drug therapy: Secondary | ICD-10-CM | POA: Diagnosis not present

## 2024-03-06 DIAGNOSIS — D72829 Elevated white blood cell count, unspecified: Secondary | ICD-10-CM

## 2024-03-06 DIAGNOSIS — F17290 Nicotine dependence, other tobacco product, uncomplicated: Secondary | ICD-10-CM | POA: Diagnosis not present

## 2024-03-06 DIAGNOSIS — D582 Other hemoglobinopathies: Secondary | ICD-10-CM | POA: Diagnosis not present

## 2024-03-06 LAB — CBC WITH DIFFERENTIAL (CANCER CENTER ONLY)
Abs Immature Granulocytes: 0 K/uL (ref 0.00–0.07)
Band Neutrophils: 0 %
Basophils Absolute: 0.1 K/uL (ref 0.0–0.1)
Basophils Relative: 1 %
Blasts: 0 %
Eosinophils Absolute: 1.5 K/uL — ABNORMAL HIGH (ref 0.0–0.5)
Eosinophils Relative: 12 %
HCT: 40.8 % (ref 36.0–46.0)
Hemoglobin: 14.6 g/dL (ref 12.0–15.0)
Immature Granulocytes: 0 %
Lymphocytes Relative: 33 %
Lymphs Abs: 4.1 K/uL — ABNORMAL HIGH (ref 0.7–4.0)
MCH: 26.6 pg (ref 26.0–34.0)
MCHC: 35.8 g/dL (ref 30.0–36.0)
MCV: 74.3 fL — ABNORMAL LOW (ref 80.0–100.0)
Metamyelocytes Relative: 0 %
Monocytes Absolute: 0.8 K/uL (ref 0.1–1.0)
Monocytes Relative: 6 %
Myelocytes: 0 %
Neutro Abs: 6 K/uL (ref 1.7–7.7)
Neutrophils Relative %: 48 %
Other: 0 %
Platelet Count: 464 K/uL — ABNORMAL HIGH (ref 150–400)
Promyelocytes Relative: 0 %
RBC: 5.49 MIL/uL — ABNORMAL HIGH (ref 3.87–5.11)
RDW: 14 % (ref 11.5–15.5)
WBC Count: 12.5 K/uL — ABNORMAL HIGH (ref 4.0–10.5)
nRBC: 0 % (ref 0.0–0.2)
nRBC: 0 /100{WBCs}

## 2024-03-06 LAB — TECHNOLOGIST SMEAR REVIEW
Plt Morphology: NORMAL
RBC MORPHOLOGY: NORMAL
WBC MORPHOLOGY: NORMAL

## 2024-03-06 NOTE — Assessment & Plan Note (Signed)
No intervention needed.  Recommend genetic counseling when she plans family

## 2024-03-06 NOTE — Assessment & Plan Note (Signed)
 Previous workup showed Patient has normal LDH, peripheral flowcytometry immunophenotypic abnormality, negative hepatitis, negative HIV, no M protein on monoclonal gammopathy workup, negative BCR-ABL, normal ANA, ESR, CRP Normal tryptase, negative JAK2 mutation with reflex, negative CHIC2 or rearrangement of PDGFRA, PDGFRB, or FGFR1,  Eosinophilia, most like due to chronic inflammation, allergy, chronic sinusitis. Likely reactive. I will hold off additional workup.

## 2024-03-06 NOTE — Assessment & Plan Note (Signed)
Likely reactive 

## 2024-03-06 NOTE — Progress Notes (Signed)
 Hematology/Oncology Progress note [phone] [phone]        REFERRING PROVIDER: Cletus Glenn   CHIEF COMPLAINTS/REASON FOR VISIT:  Leukocytosis, thrombocytosis  ASSESSMENT & PLAN:   Leukocytosis Previous workup showed Patient has normal LDH, peripheral flowcytometry immunophenotypic abnormality, negative hepatitis, negative HIV, no M protein on monoclonal gammopathy workup, negative BCR-ABL, normal ANA, ESR, CRP Normal tryptase, negative JAK2 mutation with reflex, negative CHIC2 or rearrangement of PDGFRA, PDGFRB, or FGFR1,  Eosinophilia, most like due to chronic inflammation, allergy, chronic sinusitis. Likely reactive. I will hold off additional workup.  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  Recommend genetic counseling when she plans family  Thrombocytosis Likely reactive  Follow-up as needed. [facility] All questions were answered. The patient knows to call the clinic with any problems, questions or concerns.  Zelphia Cap, MD, PhD [facility] Hematology Oncology 03/06/2024    HISTORY OF PRESENTING ILLNESS:  April Rush is a  23 y.o.  female with PMH listed below who was referred to me for evaluation of leukocytosis Reviewed patient' recent labs obtained by PCP.  10/07/2022 CBC showed elevated white count of 13.9, hemoglobin 14.6, MCV 77, platelet 166, vitamin B12 330 Iron saturation 20, ferritin 96.  Differential showed increased absolute lymphocyte, eosinophils, basophils. Previous lab records reviewed. Leukocytosis is new onset since March 2024.  Patient had normal white count in 2019, 2021, 2023.  She had 1 episode of mild leukocytosis with white count of 11.4 on 05/10/2017. 09/03/2014, WBC 13.8.  Patient denies unintentional weight loss, fever, chills.  She feels hot at night but denies any soaking sweat She denies smoking, recreational drug use, chronic oral steroid use.  She uses steroid inhaler twice daily  since September 2023 She also has history of rash/hives and she follows up with immunology for allergy shots. Patient noticed nauseated, near syncope episodes.  Mother reports purple fingertips during these episodes.  She sleeps all the time.  Fatigued.   multiple complaints.  She reports and shakes, really fatigued.  Dizzy and passing out episodes. She feels that these new symptoms has replaced her old symptoms. No unintentional weight loss, night sweats or fever. + nasal congestion.  11/05/2022 MRI brain without contrast showed no evidence of intracranial abnormality.  Severe paranasal sinus mucosal thickening.  INTERVAL HISTORY April Rush is a 23 y.o. female who has above history reviewed by me today presents for follow up visit for eosinophilia, thrombocytosis and RBC microcytosis. 08/01/2023  had sinus procedure. Pathology showed chronic sinusitis with eosinophilia.  Patient reports improvement of sinus pressure after her procedure. Patient uses inhaler. Denies any unintentional weight loss, night sweat. She feels well.  No new complaints.   MEDICAL HISTORY:  Past Medical History:  Diagnosis Date   Anxiety    Asthma    Hemoglobin C trait    followed by oncology   Hypertension    white coat syndrome   Patient denies medical problems    Tachycardia     SURGICAL HISTORY: Past Surgical History:  Procedure Laterality Date   ETHMOIDECTOMY Bilateral 08/01/2023   Procedure: ETHMOIDECTOMY;  Surgeon: Karis Clunes, MD;  Location: [facility];  Service: ENT;  Laterality: Bilateral;   FRONTAL SINUS EXPLORATION Bilateral 08/01/2023   Procedure: FRONTAL SINUS EXPLORATION;  Surgeon: Karis Clunes, MD;  Location: [facility];  Service: ENT;  Laterality: Bilateral;   MAXILLARY ANTROSTOMY Bilateral 08/01/2023   Procedure: MAXILLARY ANTROSTOMY;  Surgeon: Karis Clunes, MD;  Location:  SURGERY CENTER;  Service: ENT;  Laterality: Bilateral;   SINUS ENDO WITH FUSION Bilateral  08/01/2023   Procedure: SINUS ENDO WITH STEALTH NAVIGATION;  Surgeon: Karis Clunes, MD;  Location: [facility];  Service: ENT;  Laterality: Bilateral;   SPHENOIDECTOMY Bilateral 08/01/2023   Procedure: SPHENOIDECTOMY;  Surgeon: Karis Clunes, MD;  Location: [facility];  Service: ENT;  Laterality: Bilateral;   TONSILLECTOMY     Adenoidectomy   TURBINATE REDUCTION Bilateral 08/01/2023   Procedure: TURBINATE REDUCTION;  Surgeon: Karis Clunes, MD;  Location: [facility];  Service: ENT;  Laterality: Bilateral;    SOCIAL HISTORY: Social History   Socioeconomic History   Marital status: Single    Spouse name: Not on file   Number of children: Not on file   Years of education: Not on file   Highest education level: Not on file  Occupational History   Occupation: student  Tobacco Use   Smoking status: Never   Smokeless tobacco: Never  Vaping Use   Vaping status: Every Day   Substances: Nicotine, Flavoring  Substance and Sexual Activity   Alcohol use: Not Currently    Comment: tried a sip before   Drug use: Never   Sexual activity: Yes    Partners: Male    Birth control/protection: Injection  Other Topics Concern   Not on file  Social History Narrative   Not on file   Social Drivers of Health   Financial Resource Strain: Low Risk  (12/27/2023)   Received from [facility]   Overall Financial Resource Strain (CARDIA)    Difficulty of Paying Living Expenses: Not hard at all  Food Insecurity: No Food Insecurity (12/27/2023)   Received from [facility]   Hunger Vital Sign    Within the past 12 months, you worried that your food would run out before you got the money to buy more.: Never true    Within the past 12 months, the food you bought just didn't last and you didn't have money to get more.: Never true  Transportation Needs: No Transportation Needs (12/27/2023)   [facility] from [facility]  Transportation    In the past 12 months, has lack of transportation kept you from medical appointments or from getting medications?: No    Lack of Transportation (Non-Medical): No  Physical Activity: Not on file  Stress: Not on file  Social Connections: Not on file  Intimate Partner Violence: Not At Risk (11/10/2022)   Humiliation, Afraid, Rape, and Kick questionnaire    Fear of Current or Ex-Partner: No    Emotionally Abused: No    Physically Abused: No    Sexually Abused: No    FAMILY HISTORY: Family History  Problem Relation Age of Onset   Cirrhosis Maternal Grandfather        not related to alcohol   Hypertension Father     ALLERGIES:  is allergic to pollen extract-tree extract [pollen extract].  MEDICATIONS:  Current Outpatient Medications  Medication Sig Dispense Refill   azelastine  (ASTELIN ) 0.1 % nasal spray Place 1 spray into both nostrils 2 (two) times daily. Use in each nostril as directed 30 mL 12   budesonide-formoterol (SYMBICORT) 160-4.5 MCG/ACT inhaler Inhale 2 puffs into the lungs 2 (two) times daily.     fluticasone  (FLONASE ) 50 MCG/ACT nasal spray Place 2 sprays into both nostrils daily. 16 g 10   escitalopram (LEXAPRO) 10 MG tablet Take 10 mg by mouth daily. (  Patient not taking: Reported on 03/06/2024)     nortriptyline (PAMELOR) 10 MG capsule Start Nortriptyline (Pamelor) 10 mg nightly for one week, then increase to 20 mg nightly (Patient not taking: Reported on 03/06/2024)     No current facility-administered medications for this visit.    Review of Systems  Constitutional:  Negative for appetite change, chills, fatigue and fever.  HENT:   Negative for hearing loss and voice change.   Eyes:  Negative for eye problems.  Respiratory:  Negative for chest tightness and cough.   Cardiovascular:  Negative for chest pain.  Gastrointestinal:  Negative for abdominal distention, abdominal pain, blood in stool and nausea.  Endocrine: Negative for hot flashes.   Genitourinary:  Negative for difficulty urinating and frequency.   Musculoskeletal:  Negative for arthralgias.  Skin:  Negative for itching and rash.  Neurological:  Negative for extremity weakness and light-headedness.  Hematological:  Negative for adenopathy.  Psychiatric/Behavioral:  Negative for confusion.     PHYSICAL EXAMINATION:  Vitals:   03/06/24 1401  BP: 116/84  Pulse: 85  Resp: 17  Temp: 98.9 F (37.2 C)  SpO2: 97%   Filed Weights   03/06/24 1401  Weight: 204 lb 9.6 oz (92.8 kg)    Physical Exam Constitutional:      General: She is not in acute distress. HENT:     Head: Normocephalic and atraumatic.  Eyes:     General: No scleral icterus. Cardiovascular:     Rate and Rhythm: Normal rate and regular rhythm.     Heart sounds: Normal heart sounds.  Pulmonary:     Effort: Pulmonary effort is normal. No respiratory distress.  Abdominal:     General: There is no distension.  Musculoskeletal:        General: Normal range of motion.     Cervical back: Normal range of motion and neck supple.  Skin:    Findings: No rash.  Neurological:     Mental Status: She is alert and oriented to person, place, and time. Mental status is at baseline.  Psychiatric:        Mood and Affect: Mood normal.      RADIOGRAPHIC STUDIES: I have personally reviewed the radiological images as listed and agreed with the findings in the report. No results found.  LABORATORY DATA:  I have reviewed the data as listed    Latest Ref Rng & Units 03/06/2024    1:20 PM 08/09/2023    2:57 PM 11/10/2022   11:49 AM  CBC  WBC 4.0 - 10.5 K/uL 12.5  14.0  9.9   Hemoglobin 12.0 - 15.0 g/dL 85.3  85.9  85.2   Hematocrit 36.0 - 46.0 % 40.8  39.2  41.1   Platelets 150 - 400 K/uL 464  529  446       Latest Ref Rng & Units 11/10/2022   11:49 AM 08/25/2022    5:51 PM 04/26/2022    9:41 PM  CMP  Glucose 70 - 99 mg/dL 94  889  890   BUN 6 - 20 mg/dL 11  11  9    Creatinine 0.44 - 1.00 mg/dL  9.23  9.17  9.17   Sodium 135 - 145 mmol/L 138  139  136   Potassium 3.5 - 5.1 mmol/L 3.9  3.9  3.6   Chloride 98 - 111 mmol/L 112  112  107   CO2 22 - 32 mmol/L 18  18  20    Calcium 8.9 - 10.3  mg/dL 9.2  7.8  9.0   Total Protein 6.5 - 8.1 g/dL 7.5     Total Bilirubin 0.3 - 1.2 mg/dL 0.8     Alkaline Phos 38 - 126 U/L 78     AST 15 - 41 U/L 20     ALT 0 - 44 U/L 25

## 2024-07-17 ENCOUNTER — Ambulatory Visit (INDEPENDENT_AMBULATORY_CARE_PROVIDER_SITE_OTHER): Admitting: Otolaryngology
